# Patient Record
Sex: Male | Born: 1945 | Race: Black or African American | Hispanic: No | Marital: Married | Smoking: Former smoker
Health system: Southern US, Community
[De-identification: ages and names within clinical notes are randomized; demographics above are authoritative.]

## PROBLEM LIST (undated history)

## (undated) DIAGNOSIS — Z951 Presence of aortocoronary bypass graft: Secondary | ICD-10-CM

## (undated) DIAGNOSIS — I25119 Atherosclerotic heart disease of native coronary artery with unspecified angina pectoris: Secondary | ICD-10-CM

## (undated) DIAGNOSIS — R7303 Prediabetes: Secondary | ICD-10-CM

## (undated) DIAGNOSIS — R072 Precordial pain: Secondary | ICD-10-CM

## (undated) DIAGNOSIS — I214 Non-ST elevation (NSTEMI) myocardial infarction: Secondary | ICD-10-CM

## (undated) DIAGNOSIS — I209 Angina pectoris, unspecified: Secondary | ICD-10-CM

## (undated) DIAGNOSIS — I10 Essential (primary) hypertension: Secondary | ICD-10-CM

## (undated) DIAGNOSIS — I2510 Atherosclerotic heart disease of native coronary artery without angina pectoris: Secondary | ICD-10-CM

## (undated) DIAGNOSIS — I219 Acute myocardial infarction, unspecified: Secondary | ICD-10-CM

## (undated) DIAGNOSIS — F17210 Nicotine dependence, cigarettes, uncomplicated: Secondary | ICD-10-CM

## (undated) DIAGNOSIS — E785 Hyperlipidemia, unspecified: Secondary | ICD-10-CM

## (undated) DIAGNOSIS — E782 Mixed hyperlipidemia: Secondary | ICD-10-CM

## (undated) HISTORY — DX: Acute myocardial infarction, unspecified: I21.9

## (undated) HISTORY — PX: CATARACT EXTRACTION, BILATERAL: SHX1313

## (undated) HISTORY — DX: Hyperlipidemia, unspecified: E78.5

## (undated) HISTORY — DX: Atherosclerotic heart disease of native coronary artery without angina pectoris: I25.10

## (undated) HISTORY — DX: Angina pectoris, unspecified: I20.9

## (undated) HISTORY — DX: Prediabetes: R73.03

## (undated) HISTORY — DX: Essential (primary) hypertension: I10

---
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
## 1973-03-31 HISTORY — PX: OTHER SURGICAL HISTORY: SHX169

## 2002-03-31 HISTORY — PX: CORONARY ANGIOPLASTY WITH STENT PLACEMENT: SHX49

## 2014-04-11 DIAGNOSIS — E782 Mixed hyperlipidemia: Secondary | ICD-10-CM | POA: Insufficient documentation

## 2014-04-11 DIAGNOSIS — I10 Essential (primary) hypertension: Secondary | ICD-10-CM

## 2014-04-11 DIAGNOSIS — R072 Precordial pain: Secondary | ICD-10-CM

## 2014-04-11 HISTORY — DX: Mixed hyperlipidemia: E78.2

## 2014-04-11 HISTORY — DX: Precordial pain: R07.2

## 2014-04-11 HISTORY — DX: Essential (primary) hypertension: I10

## 2014-10-25 DIAGNOSIS — F17210 Nicotine dependence, cigarettes, uncomplicated: Secondary | ICD-10-CM

## 2014-10-25 DIAGNOSIS — Z87891 Personal history of nicotine dependence: Secondary | ICD-10-CM

## 2014-10-25 HISTORY — DX: Personal history of nicotine dependence: Z87.891

## 2014-10-25 HISTORY — DX: Nicotine dependence, cigarettes, uncomplicated: F17.210

## 2015-11-30 HISTORY — PX: CARDIAC CATHETERIZATION: SHX172

## 2015-12-27 DIAGNOSIS — I214 Non-ST elevation (NSTEMI) myocardial infarction: Secondary | ICD-10-CM

## 2015-12-27 HISTORY — DX: Non-ST elevation (NSTEMI) myocardial infarction: I21.4

## 2015-12-31 DIAGNOSIS — I25119 Atherosclerotic heart disease of native coronary artery with unspecified angina pectoris: Secondary | ICD-10-CM

## 2015-12-31 DIAGNOSIS — I2510 Atherosclerotic heart disease of native coronary artery without angina pectoris: Principal | ICD-10-CM

## 2015-12-31 HISTORY — DX: Atherosclerotic heart disease of native coronary artery with unspecified angina pectoris: I25.119

## 2015-12-31 HISTORY — PX: CORONARY ARTERY BYPASS GRAFT: SHX141

## 2016-01-10 DIAGNOSIS — Z951 Presence of aortocoronary bypass graft: Secondary | ICD-10-CM

## 2016-01-10 HISTORY — DX: Presence of aortocoronary bypass graft: Z95.1

## 2016-01-22 ENCOUNTER — Encounter: Payer: Self-pay | Admitting: Cardiothoracic Surgery

## 2016-01-23 ENCOUNTER — Encounter: Payer: Self-pay | Admitting: Cardiology

## 2016-01-23 DIAGNOSIS — I2510 Atherosclerotic heart disease of native coronary artery without angina pectoris: Secondary | ICD-10-CM
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
## 2016-01-28 ENCOUNTER — Other Ambulatory Visit: Payer: Self-pay | Admitting: Cardiothoracic Surgery

## 2016-01-28 DIAGNOSIS — Z951 Presence of aortocoronary bypass graft: Secondary | ICD-10-CM

## 2016-01-29 ENCOUNTER — Institutional Professional Consult (permissible substitution) (INDEPENDENT_AMBULATORY_CARE_PROVIDER_SITE_OTHER): Payer: Medicare Other | Admitting: Cardiothoracic Surgery

## 2016-01-29 ENCOUNTER — Other Ambulatory Visit: Payer: Self-pay

## 2016-01-29 ENCOUNTER — Ambulatory Visit
Admission: RE | Admit: 2016-01-29 | Discharge: 2016-01-29 | Disposition: A | Discharge status: Home / Self Care | Payer: Medicare Other | Source: Ambulatory Visit | Attending: Cardiothoracic Surgery | Admitting: Cardiothoracic Surgery

## 2016-01-29 VITALS — BP 132/79 | HR 78 | Resp 16 | Ht 66.0 in | Wt 171.0 lb

## 2016-01-29 DIAGNOSIS — Z951 Presence of aortocoronary bypass graft: Secondary | ICD-10-CM

## 2016-01-29 DIAGNOSIS — I214 Non-ST elevation (NSTEMI) myocardial infarction: Secondary | ICD-10-CM

## 2016-01-29 DIAGNOSIS — I2510 Atherosclerotic heart disease of native coronary artery without angina pectoris: Secondary | ICD-10-CM
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
## 2016-01-30 ENCOUNTER — Other Ambulatory Visit: Payer: Self-pay | Admitting: *Deleted

## 2016-01-30 DIAGNOSIS — R918 Other nonspecific abnormal finding of lung field: Secondary | ICD-10-CM

## 2016-01-31 ENCOUNTER — Telehealth (HOSPITAL_COMMUNITY): Payer: Self-pay | Admitting: *Deleted

## 2016-01-31 NOTE — Telephone Encounter (Signed)
Received referral from Dr. Wynonia Lawman office for cardiac rehab.  Pt called and message left for pt to please contact for additional information and for sign up. Cherre Huger, BSN

## 2016-02-26 ENCOUNTER — Encounter (HOSPITAL_COMMUNITY)
Admission: RE | Admit: 2016-02-26 | Discharge: 2016-02-26 | Disposition: A | Discharge status: Home / Self Care | Payer: Medicare Other | Source: Ambulatory Visit | Attending: Cardiology | Admitting: Cardiology

## 2016-02-26 ENCOUNTER — Encounter (HOSPITAL_COMMUNITY): Payer: Self-pay

## 2016-02-26 VITALS — BP 142/68 | HR 78 | Ht 65.0 in | Wt 181.2 lb

## 2016-02-26 DIAGNOSIS — Z951 Presence of aortocoronary bypass graft: Secondary | ICD-10-CM | POA: Diagnosis present

## 2016-02-26 DIAGNOSIS — I214 Non-ST elevation (NSTEMI) myocardial infarction: Secondary | ICD-10-CM

## 2016-02-26 DIAGNOSIS — Z48812 Encounter for surgical aftercare following surgery on the circulatory system: Secondary | ICD-10-CM | POA: Insufficient documentation

## 2016-02-26 NOTE — Progress Notes (Signed)
Cardiac Rehab Medication Review by a Pharmacist  Does the patient  feel that his/her medications are working for him/her?  Somewhat - cannot tell any difference   Has the patient been experiencing any side effects to the medications prescribed?  no  Does the patient measure his/her own blood pressure or blood glucose at home?  yes   Does the patient have any problems obtaining medications due to transportation or finances?   no  Understanding of regimen: good Understanding of indications: fair Potential of compliance: good    Pharmacist comments: Patient presents in fair spirits with wife ambulating without assistance. Patient reports he is no longer taking KCl, amiodarone, ferrous sulfate, polyethylene glycol at the direction of his physician. Those have been discontinued from the patient's list. All doses, dosing schedules and indications were reviewed with the patient and his wife. Patient denies further questions.    Carlean Jews, Pharm.D. PGY1 Pharmacy Resident 11/28/20173:11 PM Pager [phone]

## 2016-02-26 NOTE — Progress Notes (Signed)
Cardiac Individual Treatment Plan  Patient Details  Name: Gabriel [name] MRN: [id number] Date of Birth: January 27, 1946 Referring Provider:   Flowsheet Row CARDIAC REHAB PHASE II ORIENTATION from 02/26/2016 in [place]  Referring Provider  Jacolyn Reedy., MD.      Initial Encounter Date:  [facility] PHASE II ORIENTATION from 02/26/2016 in [place]  Date  02/26/16  Referring Provider  Jacolyn Reedy., MD.      Visit Diagnosis: S/P CABG x 3  NSTEMI (non-ST elevated myocardial infarction) ([place])  Patient's Home Medications on Admission:  Current Outpatient Prescriptions:  .  atorvastatin (LIPITOR) 80 MG tablet, TAKE 1 TABLET EVERY DAY, Disp: , Rfl:  .  carvedilol (COREG) 3.125 MG tablet, Take 3.125 mg by mouth 2 (two) times daily with a meal. , Disp: , Rfl:  .  clopidogrel (PLAVIX) 75 MG tablet, TAKE 1 TABLET (75 MG TOTAL) BY MOUTH DAILY., Disp: , Rfl:  .  EQ ASPIRIN ADULT LOW DOSE 81 MG EC tablet, Take 81 mg by mouth daily. , Disp: , Rfl:  .  lisinopril (PRINIVIL,ZESTRIL) 5 MG tablet, Take 5 mg by mouth daily. , Disp: , Rfl:  .  multivitamin-iron-minerals-folic acid (CENTRUM) chewable tablet, Chew 1 tablet by mouth daily. , Disp: , Rfl:   Past Medical History: Past Medical History:  Diagnosis Date  . CAD (coronary artery disease)   . Hyperlipidemia   . Hypertension   . MI (myocardial infarction)     Tobacco Use: History  Smoking Status  . Former Smoker  . Packs/day: 1.00  . Years: 50.00  . Types: Cigarettes  . Quit date: 10/29/2015  Smokeless Tobacco  . Never Used    Labs: Recent Review Flowsheet Data    There is no flowsheet data to display.      Capillary Blood Glucose: No results found for: GLUCAP   Exercise Target Goals: Date: 02/26/16  Exercise Program Goal: Individual exercise prescription set with THRR, safety & activity barriers. Participant demonstrates ability to  understand and report RPE using BORG scale, to self-measure pulse accurately, and to acknowledge the importance of the exercise prescription.  Exercise Prescription Goal: Starting with aerobic activity 30 plus minutes a day, 3 days per week for initial exercise prescription. Provide home exercise prescription and guidelines that participant acknowledges understanding prior to discharge.  Activity Barriers & Risk Stratification:     Activity Barriers & Cardiac Risk Stratification - 02/26/16 1625      Activity Barriers & Cardiac Risk Stratification   Activity Barriers Other (comment)   Comments Numbness in right/lateral part of leg.   Cardiac Risk Stratification High      6 Minute Walk:     6 Minute Walk    Row Name 02/26/16 1621         6 Minute Walk   Phase Initial     Distance 1326 feet     Walk Time 6 minutes     # of Rest Breaks 0     MPH 2.51     METS 2.95     RPE 11     VO2 Peak 10.32     Symptoms Yes (comment)     Comments Right calf tightness.     Resting HR 78 bpm     Resting BP 142/68     Max Ex. HR 98 bpm     Max Ex. BP 144/82     2 Minute Post BP  124/70        Initial Exercise Prescription:     Initial Exercise Prescription - 02/26/16 1600      Date of Initial Exercise RX and Referring Provider   Date 02/26/16   Referring Provider Jacolyn Reedy., MD.     Bike   Level 0.8   Minutes 10   METs 2.84     NuStep   Level 2   Minutes 10   METs 2.8     Track   Laps 11   Minutes 10   METs 2.92     Prescription Details   Frequency (times per week) 3   Duration Progress to 30 minutes of continuous aerobic without signs/symptoms of physical distress     Intensity   THRR 40-80% of Max Heartrate 61-122   Ratings of Perceived Exertion 11-13   Perceived Dyspnea 0-4     Progression   Progression Continue to progress workloads to maintain intensity without signs/symptoms of physical distress.     Resistance Training   Training Prescription  Yes   Weight 3lbs.   Reps 10-12      Perform Capillary Blood Glucose checks as needed.  Exercise Prescription Changes:   Exercise Comments:   Discharge Exercise Prescription (Final Exercise Prescription Changes):   Nutrition:  Target Goals: Understanding of nutrition guidelines, daily intake of sodium 1500mg , cholesterol 200mg , calories 30% from fat and 7% or less from saturated fats, daily to have 5 or more servings of fruits and vegetables.  Biometrics:     Pre Biometrics - 02/26/16 1624      Pre Biometrics   Height 5\' 5"  (1.651 m)   Weight 181 lb 3.5 oz (82.2 kg)   Waist Circumference 37.75 inches   Hip Circumference 38.5 inches   Waist to Hip Ratio 0.98 %   BMI (Calculated) 30.2   Triceps Skinfold 10 mm   % Body Fat 26.1 %   Grip Strength 57 kg   Flexibility 10.5 in   Single Leg Stand 30 seconds       Nutrition Therapy Plan and Nutrition Goals:   Nutrition Discharge: Nutrition Scores:   Nutrition Goals Re-Evaluation:   Psychosocial: Target Goals: Acknowledge presence or absence of depression, maximize coping skills, provide positive support system. Participant is able to verbalize types and ability to use techniques and skills needed for reducing stress and depression.  Initial Review & Psychosocial Screening:     Initial Psych Review & Screening - 02/26/16 [place]? Yes     Barriers   Psychosocial barriers to participate in program There are no identifiable barriers or psychosocial needs.     Screening Interventions   Interventions Encouraged to exercise      Quality of Life Scores:     Quality of Life - 02/26/16 1623      Quality of Life Scores   Health/Function Pre 23.1 %   Socioeconomic Pre 23.83 %   Psych/Spiritual Pre 24.86 %   Family Pre 24 %   GLOBAL Pre 23.74 %      PHQ-9: Recent Review Flowsheet Data    There is no flowsheet data to display.      Psychosocial Evaluation and  Intervention:   Psychosocial Re-Evaluation:   Vocational Rehabilitation: Provide vocational rehab assistance to qualifying candidates.   Vocational Rehab Evaluation & Intervention:     Vocational Rehab - 02/26/16 1654      Initial Vocational Rehab Evaluation &  Intervention   Assessment shows need for Vocational Rehabilitation No  Mr Huebel is retired      Education: Education Goals: Education classes will be provided on a weekly basis, covering required topics. Participant will state understanding/return demonstration of topics presented.  Learning Barriers/Preferences:     Learning Barriers/Preferences - 02/26/16 1626      Learning Barriers/Preferences   Learning Barriers None;Sight   Learning Preferences Skilled Demonstration      Education Topics: Count Your Pulse:  -Group instruction provided by verbal instruction, demonstration, patient participation and written materials to support subject.  Instructors address importance of being able to find your pulse and how to count your pulse when at home without a heart monitor.  Patients get hands on experience counting their pulse with staff help and individually.   Heart Attack, Angina, and Risk Factor Modification:  -Group instruction provided by verbal instruction, video, and written materials to support subject.  Instructors address signs and symptoms of angina and heart attacks.    Also discuss risk factors for heart disease and how to make changes to improve heart health risk factors.   Functional Fitness:  -Group instruction provided by verbal instruction, demonstration, patient participation, and written materials to support subject.  Instructors address safety measures for doing things around the house.  Discuss how to get up and down off the floor, how to pick things up properly, how to safely get out of a chair without assistance, and balance training.   Meditation and Mindfulness:  -Group instruction provided  by verbal instruction, patient participation, and written materials to support subject.  Instructor addresses importance of mindfulness and meditation practice to help reduce stress and improve awareness.  Instructor also leads participants through a meditation exercise.    Stretching for Flexibility and Mobility:  -Group instruction provided by verbal instruction, patient participation, and written materials to support subject.  Instructors lead participants through series of stretches that are designed to increase flexibility thus improving mobility.  These stretches are additional exercise for major muscle groups that are typically performed during regular warm up and cool down.   Hands Only CPR Anytime:  -Group instruction provided by verbal instruction, video, patient participation and written materials to support subject.  Instructors co-teach with AHA video for hands only CPR.  Participants get hands on experience with mannequins.   Nutrition I class: Heart Healthy Eating:  -Group instruction provided by PowerPoint slides, verbal discussion, and written materials to support subject matter. The instructor gives an explanation and review of the Therapeutic Lifestyle Changes diet recommendations, which includes a discussion on lipid goals, dietary fat, sodium, fiber, plant stanol/sterol esters, sugar, and the components of a well-balanced, healthy diet.   Nutrition II class: Lifestyle Skills:  -Group instruction provided by PowerPoint slides, verbal discussion, and written materials to support subject matter. The instructor gives an explanation and review of label reading, grocery shopping for heart health, heart healthy recipe modifications, and ways to make healthier choices when eating out.   Diabetes Question & Answer:  -Group instruction provided by PowerPoint slides, verbal discussion, and written materials to support subject matter. The instructor gives an explanation and review of  diabetes co-morbidities, pre- and post-prandial blood glucose goals, pre-exercise blood glucose goals, signs, symptoms, and treatment of hypoglycemia and hyperglycemia, and foot care basics.   Diabetes Blitz:  -Group instruction provided by PowerPoint slides, verbal discussion, and written materials to support subject matter. The instructor gives an explanation and review of the physiology behind type 1  and type 2 diabetes, diabetes medications and rational behind using different medications, pre- and post-prandial blood glucose recommendations and Hemoglobin A1c goals, diabetes diet, and exercise including blood glucose guidelines for exercising safely.    Portion Distortion:  -Group instruction provided by PowerPoint slides, verbal discussion, written materials, and food models to support subject matter. The instructor gives an explanation of serving size versus portion size, changes in portions sizes over the last 20 years, and what consists of a serving from each food group.   Stress Management:  -Group instruction provided by verbal instruction, video, and written materials to support subject matter.  Instructors review role of stress in heart disease and how to cope with stress positively.     Exercising on Your Own:  -Group instruction provided by verbal instruction, power point, and written materials to support subject.  Instructors discuss benefits of exercise, components of exercise, frequency and intensity of exercise, and end points for exercise.  Also discuss use of nitroglycerin and activating EMS.  Review options of places to exercise outside of rehab.  Review guidelines for sex with heart disease.   Cardiac Drugs I:  -Group instruction provided by verbal instruction and written materials to support subject.  Instructor reviews cardiac drug classes: antiplatelets, anticoagulants, beta blockers, and statins.  Instructor discusses reasons, side effects, and lifestyle considerations  for each drug class.   Cardiac Drugs II:  -Group instruction provided by verbal instruction and written materials to support subject.  Instructor reviews cardiac drug classes: angiotensin converting enzyme inhibitors (ACE-I), angiotensin II receptor blockers (ARBs), nitrates, and calcium channel blockers.  Instructor discusses reasons, side effects, and lifestyle considerations for each drug class.   Anatomy and Physiology of the Circulatory System:  -Group instruction provided by verbal instruction, video, and written materials to support subject.  Reviews functional anatomy of heart, how it relates to various diagnoses, and what role the heart plays in the overall system.   Knowledge Questionnaire Score:     Knowledge Questionnaire Score - 02/26/16 1615      Knowledge Questionnaire Score   Pre Score 24/28      Core Components/Risk Factors/Patient Goals at Admission:     Personal Goals and Risk Factors at Admission - 02/26/16 1616      Core Components/Risk Factors/Patient Goals on Admission    Weight Management Yes;Obesity;Weight Loss   Admit Weight 181 lb 3.5 oz (82.2 kg)   Goal Weight: Short Term 176 lb (79.8 kg)   Goal Weight: Long Term 171 lb (77.6 kg)   Expected Outcomes Short Term: Continue to assess and modify interventions until short term weight is achieved;Long Term: Adherence to nutrition and physical activity/exercise program aimed toward attainment of established weight goal;Weight Loss: Understanding of general recommendations for a balanced deficit meal plan, which promotes 1-2 lb weight loss per week and includes a negative energy balance of [phone] kcal/d;Understanding recommendations for meals to include 15-35% energy as protein, 25-35% energy from fat, 35-60% energy from carbohydrates, less than 200mg  of dietary cholesterol, 20-35 gm of total fiber daily;Understanding of distribution of calorie intake throughout the day with the consumption of 4-5 meals/snacks    Sedentary Yes   Intervention Provide advice, education, support and counseling about physical activity/exercise needs.;Develop an individualized exercise prescription for aerobic and resistive training based on initial evaluation findings, risk stratification, comorbidities and participant's personal goals.   Expected Outcomes Achievement of increased cardiorespiratory fitness and enhanced flexibility, muscular endurance and strength shown through measurements of functional capacity and personal statement  of participant.   Increase Strength and Stamina Yes   Intervention Provide advice, education, support and counseling about physical activity/exercise needs.;Develop an individualized exercise prescription for aerobic and resistive training based on initial evaluation findings, risk stratification, comorbidities and participant's personal goals.   Expected Outcomes Achievement of increased cardiorespiratory fitness and enhanced flexibility, muscular endurance and strength shown through measurements of functional capacity and personal statement of participant.   Tobacco Cessation Yes  Quit on 10/29/15.   Number of packs per day 1   Hypertension Yes   Intervention Provide education on lifestyle modifcations including regular physical activity/exercise, weight management, moderate sodium restriction and increased consumption of fresh fruit, vegetables, and low fat dairy, alcohol moderation, and smoking cessation.;Monitor prescription use compliance.   Expected Outcomes Short Term: Continued assessment and intervention until BP is < 140/23mm HG in hypertensive participants. < 130/48mm HG in hypertensive participants with diabetes, heart failure or chronic kidney disease.;Long Term: Maintenance of blood pressure at goal levels.   Lipids Yes   Intervention Provide education and support for participant on nutrition & aerobic/resistive exercise along with prescribed medications to achieve LDL 70mg , HDL >40mg .    Expected Outcomes Short Term: Participant states understanding of desired cholesterol values and is compliant with medications prescribed. Participant is following exercise prescription and nutrition guidelines.;Long Term: Cholesterol controlled with medications as prescribed, with individualized exercise RX and with personalized nutrition plan. Value goals: LDL < 70mg , HDL > 40 mg.   Personal Goal Other Yes   Personal Goal Increase upper body strength.   Intervention Progress upper body hand weights and initiate appropriate strength training program to promote increased upper body strength.   Expected Outcomes Increased upper body strength as measured by grip strength measurement.      Core Components/Risk Factors/Patient Goals Review:    Core Components/Risk Factors/Patient Goals at Discharge (Final Review):    ITP Comments:     ITP Comments    Row Name 02/26/16 1333           ITP Comments Medical Director- Dr. Fransico Him, MD.          Comments: Mr Iwen attended orientation from 1300 to 1530 to review rules and guidelines for program. Completed 6 minute walk test, Intitial ITP, and exercise prescription.  VSS. Telemetry-Sinus Rhythm with intermittent PVC's. Dr Thurman Coyer office called and notified.left message on voicemail and faxed today's ECG tracings for review Asymptomatic. Will continue to monitor the patient throughout  the program.Jveon Pound Venetia Maxon, RN,BSN 02/26/2016 4:59 PM

## 2016-03-03 ENCOUNTER — Encounter (HOSPITAL_COMMUNITY): Payer: Medicare Other

## 2016-03-05 ENCOUNTER — Encounter (HOSPITAL_COMMUNITY)
Admission: RE | Admit: 2016-03-05 | Discharge: 2016-03-05 | Disposition: A | Discharge status: Home / Self Care | Payer: Medicare Other | Source: Ambulatory Visit | Attending: Cardiology | Admitting: Cardiology

## 2016-03-05 DIAGNOSIS — I214 Non-ST elevation (NSTEMI) myocardial infarction: Secondary | ICD-10-CM | POA: Insufficient documentation

## 2016-03-05 DIAGNOSIS — Z951 Presence of aortocoronary bypass graft: Secondary | ICD-10-CM | POA: Insufficient documentation

## 2016-03-05 NOTE — Progress Notes (Signed)
Daily Session Note  Patient Details  Name: Gabriel [name] MRN: [id number] Date of Birth: Aug 10, 1945 Referring Provider:   Flowsheet Row CARDIAC REHAB PHASE II ORIENTATION from 02/26/2016 in [place]  Referring Provider  Jacolyn Reedy., MD.      Encounter Date: 03/05/2016  Check In:     Session Check In - 03/05/16 1113      Check-In   Location [facility]   Staff Present Barnet Pall, RN, BSN;Amber Fair, MS, ACSM RCEP, Exercise Physiologist;Olinty Celesta Aver, MS, ACSM CEP, Exercise Physiologist;Ashley Armstrong, MS, Exercise Physiologist;Phallon Haydu Wilber Oliphant, RN, BSN   Supervising physician immediately available to respond to emergencies [facility] immediately available   Physician(s) Dr. Allyson Sabal   Medication changes reported     No   Fall or balance concerns reported    No   Warm-up and Cool-down Performed as group-led instruction   Resistance Training Performed No   VAD Patient? No     Pain Assessment   Currently in Pain? No/denies      Capillary Blood Glucose: No results found for this or any previous visit (from the past 24 hour(s)).   Goals Met:  Exercise tolerated well Personal goals reviewed  Goals Unmet:  Not Applicable  Comments:  Pt started full exercise in cardiac rehab today.  Pt tolerated light exercise without difficulty. VSS, telemetry-SR with frequent pvc, asymptomatic.  Medication list reconciled with pt wife who arranges his medications at homes. Pt denies barriers to medication compliance.  PSYCHOSOCIAL ASSESSMENT:  PHQ-0. Pt exhibits positive coping skills, hopeful outlook with supportive family. Pt wife accompanied him to orientation and she is present today.  QUALITY OF LIFE SCORE REVIEW  Pt completed Quality of Life survey as a participant in Cardiac Rehab. Scores 21.0 or below are considered low. Pt scored the following      Quality of Life - 02/26/16 1623      Quality of Life Scores   Health/Function Pre 23.1 %   Socioeconomic Pre 23.83 %   Psych/Spiritual Pre 24.86 %   Family Pre 24 %   GLOBAL Pre 23.74 %    Pt scored well above the threshold of 2l. No psychosocial needs identified at this time, no psychosocial interventions necessary.    Pt enjoys shooting pool, working with his hands around the house, home maintenance and yard work. Pt desires to increase his upper body strength.  pt had limited his arm use due to his recovery from heart surgery. As a result he feels weaker with his arm strength.  Pt also would like to lose weight. Pt desires a weight of about 180 pounds presently pt weighs around 190.  This is the heaviest pt has been and has noticed that his clothes are fitting tighter.  Pt oriented to exercise equipment and routine.    Understanding verbalized. [name] RN, BSN     Dr. Fransico Him is Medical Director for Cardiac Rehab at [facility].

## 2016-03-06 ENCOUNTER — Ambulatory Visit
Admission: RE | Admit: 2016-03-06 | Discharge: 2016-03-06 | Disposition: A | Discharge status: Home / Self Care | Payer: Medicare Other | Source: Ambulatory Visit | Attending: Cardiothoracic Surgery | Admitting: Cardiothoracic Surgery

## 2016-03-06 ENCOUNTER — Encounter: Payer: Self-pay | Admitting: Cardiothoracic Surgery

## 2016-03-06 ENCOUNTER — Ambulatory Visit (INDEPENDENT_AMBULATORY_CARE_PROVIDER_SITE_OTHER): Payer: Medicare Other | Admitting: Cardiothoracic Surgery

## 2016-03-06 VITALS — BP 129/73 | HR 78 | Resp 20 | Ht 65.0 in | Wt 180.0 lb

## 2016-03-06 DIAGNOSIS — Z951 Presence of aortocoronary bypass graft: Secondary | ICD-10-CM | POA: Diagnosis not present

## 2016-03-06 DIAGNOSIS — R918 Other nonspecific abnormal finding of lung field: Secondary | ICD-10-CM

## 2016-03-06 DIAGNOSIS — I2510 Atherosclerotic heart disease of native coronary artery without angina pectoris: Secondary | ICD-10-CM

## 2016-03-06 DIAGNOSIS — R911 Solitary pulmonary nodule: Secondary | ICD-10-CM | POA: Diagnosis not present

## 2016-03-06 NOTE — Progress Notes (Signed)
Cardiac Individual Treatment Plan  Patient Details  Name: Gabriel [name] MRN: [id number] Date of Birth: 07-30-1945 Referring Provider:   Flowsheet Row CARDIAC REHAB PHASE II ORIENTATION from 02/26/2016 in [facility]  Referring Provider  Jacolyn Reedy., MD.      Initial Encounter Date:  [place] PHASE II ORIENTATION from 02/26/2016 in [facility]  Date  02/26/16  Referring Provider  Jacolyn Reedy., MD.      Visit Diagnosis: S/P CABG x 3  NSTEMI (non-ST elevated myocardial infarction) ([place])  Patient's Home Medications on Admission:  Current Outpatient Prescriptions:  .  atorvastatin (LIPITOR) 80 MG tablet, TAKE 1 TABLET EVERY DAY, Disp: , Rfl:  .  carvedilol (COREG) 3.125 MG tablet, Take 3.125 mg by mouth 2 (two) times daily with a meal. , Disp: , Rfl:  .  clopidogrel (PLAVIX) 75 MG tablet, TAKE 1 TABLET (75 MG TOTAL) BY MOUTH DAILY., Disp: , Rfl:  .  EQ ASPIRIN ADULT LOW DOSE 81 MG EC tablet, Take 81 mg by mouth daily. , Disp: , Rfl:  .  lisinopril (PRINIVIL,ZESTRIL) 5 MG tablet, Take 5 mg by mouth daily. , Disp: , Rfl:  .  multivitamin-iron-minerals-folic acid (CENTRUM) chewable tablet, Chew 1 tablet by mouth daily. , Disp: , Rfl:   Past Medical History: Past Medical History:  Diagnosis Date  . CAD (coronary artery disease)   . Hyperlipidemia   . Hypertension   . MI (myocardial infarction)     Tobacco Use: History  Smoking Status  . Former Smoker  . Packs/day: 1.00  . Years: 50.00  . Types: Cigarettes  . Quit date: 10/29/2015  Smokeless Tobacco  . Never Used    Labs: Recent Review Flowsheet Data    There is no flowsheet data to display.      Capillary Blood Glucose: No results found for: GLUCAP   Exercise Target Goals:    Exercise Program Goal: Individual exercise prescription set with THRR, safety & activity barriers. Participant demonstrates ability to understand  and report RPE using BORG scale, to self-measure pulse accurately, and to acknowledge the importance of the exercise prescription.  Exercise Prescription Goal: Starting with aerobic activity 30 plus minutes a day, 3 days per week for initial exercise prescription. Provide home exercise prescription and guidelines that participant acknowledges understanding prior to discharge.  Activity Barriers & Risk Stratification:     Activity Barriers & Cardiac Risk Stratification - 02/26/16 1625      Activity Barriers & Cardiac Risk Stratification   Activity Barriers Other (comment)   Comments Numbness in right/lateral part of leg.   Cardiac Risk Stratification High      6 Minute Walk:     6 Minute Walk    Row Name 02/26/16 1621         6 Minute Walk   Phase Initial     Distance 1326 feet     Walk Time 6 minutes     # of Rest Breaks 0     MPH 2.51     METS 2.95     RPE 11     VO2 Peak 10.32     Symptoms Yes (comment)     Comments Right calf tightness.     Resting HR 78 bpm     Resting BP 142/68     Max Ex. HR 98 bpm     Max Ex. BP 144/82     2 Minute Post BP  124/70        Initial Exercise Prescription:     Initial Exercise Prescription - 02/26/16 1600      Date of Initial Exercise RX and Referring Provider   Date 02/26/16   Referring Provider Jacolyn Reedy., MD.     Bike   Level 0.8   Minutes 10   METs 2.84     NuStep   Level 2   Minutes 10   METs 2.8     Track   Laps 11   Minutes 10   METs 2.92     Prescription Details   Frequency (times per week) 3   Duration Progress to 30 minutes of continuous aerobic without signs/symptoms of physical distress     Intensity   THRR 40-80% of Max Heartrate 61-122   Ratings of Perceived Exertion 11-13   Perceived Dyspnea 0-4     Progression   Progression Continue to progress workloads to maintain intensity without signs/symptoms of physical distress.     Resistance Training   Training Prescription Yes    Weight 3lbs.   Reps 10-12      Perform Capillary Blood Glucose checks as needed.  Exercise Prescription Changes:   Exercise Comments:   Discharge Exercise Prescription (Final Exercise Prescription Changes):   Nutrition:  Target Goals: Understanding of nutrition guidelines, daily intake of sodium 1500mg , cholesterol 200mg , calories 30% from fat and 7% or less from saturated fats, daily to have 5 or more servings of fruits and vegetables.  Biometrics:     Pre Biometrics - 02/26/16 1624      Pre Biometrics   Height 5\' 5"  (1.651 m)   Weight 181 lb 3.5 oz (82.2 kg)   Waist Circumference 37.75 inches   Hip Circumference 38.5 inches   Waist to Hip Ratio 0.98 %   BMI (Calculated) 30.2   Triceps Skinfold 10 mm   % Body Fat 26.1 %   Grip Strength 57 kg   Flexibility 10.5 in   Single Leg Stand 30 seconds       Nutrition Therapy Plan and Nutrition Goals:     Nutrition Therapy & Goals - 03/05/16 1521      Nutrition Therapy   Diet Carb Modified, Therapeutic Lifestyle Changes     Personal Nutrition Goals   Personal Goal #1 1-2 lb wt loss/week to a wt loss goal of 6-24 lb at graduation from Cardiac Rehab   Personal Goal #2 Pt to have an understanding of DM diet     Intervention Plan   Intervention Prescribe, educate and counsel regarding individualized specific dietary modifications aiming towards targeted core components such as weight, hypertension, lipid management, diabetes, heart failure and other comorbidities.   Expected Outcomes Short Term Goal: Understand basic principles of dietary content, such as calories, fat, sodium, cholesterol and nutrients.;Long Term Goal: Adherence to prescribed nutrition plan.      Nutrition Discharge: Nutrition Scores:   Nutrition Goals Re-Evaluation:   Psychosocial: Target Goals: Acknowledge presence or absence of depression, maximize coping skills, provide positive support system. Participant is able to verbalize types and  ability to use techniques and skills needed for reducing stress and depression.  Initial Review & Psychosocial Screening:     Initial Psych Review & Screening - 02/26/16 [place]? Yes     Barriers   Psychosocial barriers to participate in program There are no identifiable barriers or psychosocial needs.     Screening  Interventions   Interventions Encouraged to exercise      Quality of Life Scores:     Quality of Life - 02/26/16 1623      Quality of Life Scores   Health/Function Pre 23.1 %   Socioeconomic Pre 23.83 %   Psych/Spiritual Pre 24.86 %   Family Pre 24 %   GLOBAL Pre 23.74 %      PHQ-9: Recent Review Flowsheet Data    Depression screen PHQ 2/9 03/05/2016   Decreased Interest 0   Down, Depressed, Hopeless 0   PHQ - 2 Score 0      Psychosocial Evaluation and Intervention:     Psychosocial Evaluation - 03/06/16 1415      Psychosocial Evaluation & Interventions   Interventions Encouraged to exercise with the program and follow exercise prescription   Comments Pt with suppportive family.  no needs identified      Psychosocial Re-Evaluation:     Psychosocial Re-Evaluation    [place] Name 03/06/16 1415             Psychosocial Re-Evaluation   Interventions Encouraged to attend Cardiac Rehabilitation for the exercise;Relaxation education;Stress management education          Vocational Rehabilitation: Provide vocational rehab assistance to qualifying candidates.   Vocational Rehab Evaluation & Intervention:     Vocational Rehab - 02/26/16 1654      Initial Vocational Rehab Evaluation & Intervention   Assessment shows need for Vocational Rehabilitation No  Mr Vajda is retired      Education: Education Goals: Education classes will be provided on a weekly basis, covering required topics. Participant will state understanding/return demonstration of topics presented.  Learning Barriers/Preferences:      Learning Barriers/Preferences - 02/26/16 1626      Learning Barriers/Preferences   Learning Barriers None;Sight   Learning Preferences Skilled Demonstration      Education Topics: Count Your Pulse:  -Group instruction provided by verbal instruction, demonstration, patient participation and written materials to support subject.  Instructors address importance of being able to find your pulse and how to count your pulse when at home without a heart monitor.  Patients get hands on experience counting their pulse with staff help and individually.   Heart Attack, Angina, and Risk Factor Modification:  -Group instruction provided by verbal instruction, video, and written materials to support subject.  Instructors address signs and symptoms of angina and heart attacks.    Also discuss risk factors for heart disease and how to make changes to improve heart health risk factors.   Functional Fitness:  -Group instruction provided by verbal instruction, demonstration, patient participation, and written materials to support subject.  Instructors address safety measures for doing things around the house.  Discuss how to get up and down off the floor, how to pick things up properly, how to safely get out of a chair without assistance, and balance training.   Meditation and Mindfulness:  -Group instruction provided by verbal instruction, patient participation, and written materials to support subject.  Instructor addresses importance of mindfulness and meditation practice to help reduce stress and improve awareness.  Instructor also leads participants through a meditation exercise.    Stretching for Flexibility and Mobility:  -Group instruction provided by verbal instruction, patient participation, and written materials to support subject.  Instructors lead participants through series of stretches that are designed to increase flexibility thus improving mobility.  These stretches are additional exercise  for major muscle groups that are typically performed during regular  warm up and cool down. Flowsheet Row CARDIAC REHAB PHASE II EXERCISE from 03/05/2016 in [facility]  Date  03/05/16  Instruction Review Code  2- meets goals/outcomes      Hands Only CPR Anytime:  -Group instruction provided by verbal instruction, video, patient participation and written materials to support subject.  Instructors co-teach with [facility] video for hands only CPR.  Participants get hands on experience with mannequins.   Nutrition I class: Heart Healthy Eating:  -Group instruction provided by PowerPoint slides, verbal discussion, and written materials to support subject matter. The instructor gives an explanation and review of the Therapeutic Lifestyle Changes diet recommendations, which includes a discussion on lipid goals, dietary fat, sodium, fiber, plant stanol/sterol esters, sugar, and the components of a well-balanced, healthy diet.   Nutrition II class: Lifestyle Skills:  -Group instruction provided by PowerPoint slides, verbal discussion, and written materials to support subject matter. The instructor gives an explanation and review of label reading, grocery shopping for heart health, heart healthy recipe modifications, and ways to make healthier choices when eating out.   Diabetes Question & Answer:  -Group instruction provided by PowerPoint slides, verbal discussion, and written materials to support subject matter. The instructor gives an explanation and review of diabetes co-morbidities, pre- and post-prandial blood glucose goals, pre-exercise blood glucose goals, signs, symptoms, and treatment of hypoglycemia and hyperglycemia, and foot care basics.   Diabetes Blitz:  -Group instruction provided by PowerPoint slides, verbal discussion, and written materials to support subject matter. The instructor gives an explanation and review of the physiology behind type 1 and type 2  diabetes, diabetes medications and rational behind using different medications, pre- and post-prandial blood glucose recommendations and Hemoglobin A1c goals, diabetes diet, and exercise including blood glucose guidelines for exercising safely.    Portion Distortion:  -Group instruction provided by PowerPoint slides, verbal discussion, written materials, and food models to support subject matter. The instructor gives an explanation of serving size versus portion size, changes in portions sizes over the last 20 years, and what consists of a serving from each food group.   Stress Management:  -Group instruction provided by verbal instruction, video, and written materials to support subject matter.  Instructors review role of stress in heart disease and how to cope with stress positively.     Exercising on Your Own:  -Group instruction provided by verbal instruction, power point, and written materials to support subject.  Instructors discuss benefits of exercise, components of exercise, frequency and intensity of exercise, and end points for exercise.  Also discuss use of nitroglycerin and activating EMS.  Review options of places to exercise outside of rehab.  Review guidelines for sex with heart disease.   Cardiac Drugs I:  -Group instruction provided by verbal instruction and written materials to support subject.  Instructor reviews cardiac drug classes: antiplatelets, anticoagulants, beta blockers, and statins.  Instructor discusses reasons, side effects, and lifestyle considerations for each drug class.   Cardiac Drugs II:  -Group instruction provided by verbal instruction and written materials to support subject.  Instructor reviews cardiac drug classes: angiotensin converting enzyme inhibitors (ACE-I), angiotensin II receptor blockers (ARBs), nitrates, and calcium channel blockers.  Instructor discusses reasons, side effects, and lifestyle considerations for each drug class.   Anatomy and  Physiology of the Circulatory System:  -Group instruction provided by verbal instruction, video, and written materials to support subject.  Reviews functional anatomy of heart, how it relates to various diagnoses, and what role  the heart plays in the overall system.   Knowledge Questionnaire Score:     Knowledge Questionnaire Score - 03/05/16 1551      Knowledge Questionnaire Score   Pre Score 24/28     DM 15/15      Core Components/Risk Factors/Patient Goals at Admission:     Personal Goals and Risk Factors at Admission - 02/26/16 1616      Core Components/Risk Factors/Patient Goals on Admission    Weight Management Yes;Obesity;Weight Loss   Admit Weight 181 lb 3.5 oz (82.2 kg)   Goal Weight: Short Term 176 lb (79.8 kg)   Goal Weight: Long Term 171 lb (77.6 kg)   Expected Outcomes Short Term: Continue to assess and modify interventions until short term weight is achieved;Long Term: Adherence to nutrition and physical activity/exercise program aimed toward attainment of established weight goal;Weight Loss: Understanding of general recommendations for a balanced deficit meal plan, which promotes 1-2 lb weight loss per week and includes a negative energy balance of [phone] kcal/d;Understanding recommendations for meals to include 15-35% energy as protein, 25-35% energy from fat, 35-60% energy from carbohydrates, less than 200mg  of dietary cholesterol, 20-35 gm of total fiber daily;Understanding of distribution of calorie intake throughout the day with the consumption of 4-5 meals/snacks   Sedentary Yes   Intervention Provide advice, education, support and counseling about physical activity/exercise needs.;Develop an individualized exercise prescription for aerobic and resistive training based on initial evaluation findings, risk stratification, comorbidities and participant's personal goals.   Expected Outcomes Achievement of increased cardiorespiratory fitness and enhanced flexibility,  muscular endurance and strength shown through measurements of functional capacity and personal statement of participant.   Increase Strength and Stamina Yes   Intervention Provide advice, education, support and counseling about physical activity/exercise needs.;Develop an individualized exercise prescription for aerobic and resistive training based on initial evaluation findings, risk stratification, comorbidities and participant's personal goals.   Expected Outcomes Achievement of increased cardiorespiratory fitness and enhanced flexibility, muscular endurance and strength shown through measurements of functional capacity and personal statement of participant.   Tobacco Cessation Yes  Quit on 10/29/15.   Number of packs per day 1   Hypertension Yes   Intervention Provide education on lifestyle modifcations including regular physical activity/exercise, weight management, moderate sodium restriction and increased consumption of fresh fruit, vegetables, and low fat dairy, alcohol moderation, and smoking cessation.;Monitor prescription use compliance.   Expected Outcomes Short Term: Continued assessment and intervention until BP is < 140/60mm HG in hypertensive participants. < 130/79mm HG in hypertensive participants with diabetes, heart failure or chronic kidney disease.;Long Term: Maintenance of blood pressure at goal levels.   Lipids Yes   Intervention Provide education and support for participant on nutrition & aerobic/resistive exercise along with prescribed medications to achieve LDL 70mg , HDL >40mg .   Expected Outcomes Short Term: Participant states understanding of desired cholesterol values and is compliant with medications prescribed. Participant is following exercise prescription and nutrition guidelines.;Long Term: Cholesterol controlled with medications as prescribed, with individualized exercise RX and with personalized nutrition plan. Value goals: LDL < 70mg , HDL > 40 mg.   Personal Goal  Other Yes   Personal Goal Increase upper body strength.   Intervention Progress upper body hand weights and initiate appropriate strength training program to promote increased upper body strength.   Expected Outcomes Increased upper body strength as measured by grip strength measurement.      Core Components/Risk Factors/Patient Goals Review:    Core Components/Risk Factors/Patient Goals at Discharge (Final Review):  ITP Comments:     ITP Comments    Row Name 02/26/16 1333           ITP Comments Medical Director- Dr. Fransico Him, MD.          Comments:  Pt is making is off to a good start toward personal goals after completing 2 sessions.Repeat Psychosocial Assessment: Pt with supportive wife, denies any Psychosocial needs or interventions at this time. . Recommend continued exercise and life style modification education including  stress management and relaxation techniques to decrease cardiac risk profile. Cherre Huger, BSN
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
## 2016-03-07 ENCOUNTER — Encounter (HOSPITAL_COMMUNITY)
Admission: RE | Admit: 2016-03-07 | Discharge: 2016-03-07 | Disposition: A | Discharge status: Home / Self Care | Payer: Medicare Other | Source: Ambulatory Visit | Attending: Cardiology | Admitting: Cardiology

## 2016-03-07 DIAGNOSIS — I214 Non-ST elevation (NSTEMI) myocardial infarction: Secondary | ICD-10-CM

## 2016-03-07 DIAGNOSIS — Z951 Presence of aortocoronary bypass graft: Secondary | ICD-10-CM

## 2016-03-10 ENCOUNTER — Encounter (HOSPITAL_COMMUNITY): Payer: Medicare Other

## 2016-03-12 ENCOUNTER — Encounter (HOSPITAL_COMMUNITY)
Admission: RE | Admit: 2016-03-12 | Discharge: 2016-03-12 | Disposition: A | Discharge status: Home / Self Care | Payer: Medicare Other | Source: Ambulatory Visit | Attending: Cardiology | Admitting: Cardiology

## 2016-03-12 DIAGNOSIS — I214 Non-ST elevation (NSTEMI) myocardial infarction: Secondary | ICD-10-CM | POA: Diagnosis not present

## 2016-03-12 DIAGNOSIS — Z951 Presence of aortocoronary bypass graft: Secondary | ICD-10-CM

## 2016-03-14 ENCOUNTER — Encounter (HOSPITAL_COMMUNITY)
Admission: RE | Admit: 2016-03-14 | Discharge: 2016-03-14 | Disposition: A | Discharge status: Home / Self Care | Payer: Medicare Other | Source: Ambulatory Visit | Attending: Cardiology | Admitting: Cardiology

## 2016-03-14 DIAGNOSIS — Z951 Presence of aortocoronary bypass graft: Secondary | ICD-10-CM

## 2016-03-14 DIAGNOSIS — I214 Non-ST elevation (NSTEMI) myocardial infarction: Secondary | ICD-10-CM

## 2016-03-17 ENCOUNTER — Encounter (HOSPITAL_COMMUNITY): Payer: Medicare Other

## 2016-03-17 ENCOUNTER — Other Ambulatory Visit: Payer: Self-pay | Admitting: *Deleted

## 2016-03-17 DIAGNOSIS — R911 Solitary pulmonary nodule: Secondary | ICD-10-CM

## 2016-03-19 ENCOUNTER — Encounter (HOSPITAL_COMMUNITY)
Admission: RE | Admit: 2016-03-19 | Discharge: 2016-03-19 | Disposition: A | Discharge status: Home / Self Care | Payer: Medicare Other | Source: Ambulatory Visit | Attending: Cardiology | Admitting: Cardiology

## 2016-03-19 DIAGNOSIS — Z951 Presence of aortocoronary bypass graft: Secondary | ICD-10-CM

## 2016-03-19 DIAGNOSIS — I214 Non-ST elevation (NSTEMI) myocardial infarction: Secondary | ICD-10-CM

## 2016-03-21 ENCOUNTER — Encounter (HOSPITAL_COMMUNITY)
Admission: RE | Admit: 2016-03-21 | Discharge: 2016-03-21 | Disposition: A | Discharge status: Home / Self Care | Payer: Medicare Other | Source: Ambulatory Visit | Attending: Cardiology | Admitting: Cardiology

## 2016-03-21 DIAGNOSIS — I214 Non-ST elevation (NSTEMI) myocardial infarction: Secondary | ICD-10-CM

## 2016-03-21 DIAGNOSIS — Z951 Presence of aortocoronary bypass graft: Secondary | ICD-10-CM

## 2016-03-26 ENCOUNTER — Encounter (HOSPITAL_COMMUNITY)
Admission: RE | Admit: 2016-03-26 | Discharge: 2016-03-26 | Disposition: A | Discharge status: Home / Self Care | Payer: Medicare Other | Source: Ambulatory Visit | Attending: Cardiology | Admitting: Cardiology

## 2016-03-26 DIAGNOSIS — I214 Non-ST elevation (NSTEMI) myocardial infarction: Secondary | ICD-10-CM

## 2016-03-26 DIAGNOSIS — Z951 Presence of aortocoronary bypass graft: Secondary | ICD-10-CM

## 2016-03-26 NOTE — Progress Notes (Signed)
Pt with noted elevation in bp at rest today with exercise. Pt reports nothing out of the ordinary for this morning.  Pt took his medications as prescribed and drank one cup of coffee which he generally has each day. For bp management pt takes Lisinopril 5 mg daily and for heart rate pt takes correg 3.125 mg twice a day.  Monitor shows SR with freq pvc's. Pt asymptomatic with no complaints.  Looking back with review of his BP shows pre hypertension readings upper 150's to low 160's over mid 80's.  Will fax rehab report to Dr. Wynonia Lawman for review. Cherre Huger, BSN

## 2016-03-26 NOTE — Progress Notes (Signed)
Reviewed home exercise with pt today.  Pt plans to walk for exercise, 3x/week in addition to coming to [facility].  Reviewed THR, pulse, RPE, sign and symptoms, and when to call 911 or MD.  Also discussed weather considerations and indoor options.  Pt voiced understanding.    Chasitie Passey [employer]

## 2016-03-27 ENCOUNTER — Telehealth (HOSPITAL_COMMUNITY): Payer: Self-pay | Admitting: Infectious Diseases

## 2016-03-28 ENCOUNTER — Encounter (HOSPITAL_COMMUNITY)
Admission: RE | Admit: 2016-03-28 | Discharge: 2016-03-28 | Disposition: A | Discharge status: Home / Self Care | Payer: Medicare Other | Source: Ambulatory Visit | Attending: Cardiology | Admitting: Cardiology

## 2016-03-28 DIAGNOSIS — Z951 Presence of aortocoronary bypass graft: Secondary | ICD-10-CM

## 2016-03-28 DIAGNOSIS — I214 Non-ST elevation (NSTEMI) myocardial infarction: Secondary | ICD-10-CM

## 2016-04-02 ENCOUNTER — Telehealth (HOSPITAL_COMMUNITY): Payer: Self-pay | Admitting: *Deleted

## 2016-04-02 ENCOUNTER — Encounter (HOSPITAL_COMMUNITY)
Admission: RE | Admit: 2016-04-02 | Discharge: 2016-04-02 | Disposition: A | Discharge status: Home / Self Care | Payer: Medicare Other | Source: Ambulatory Visit | Attending: Cardiology | Admitting: Cardiology

## 2016-04-02 DIAGNOSIS — Z951 Presence of aortocoronary bypass graft: Secondary | ICD-10-CM | POA: Insufficient documentation

## 2016-04-02 DIAGNOSIS — I214 Non-ST elevation (NSTEMI) myocardial infarction: Secondary | ICD-10-CM | POA: Insufficient documentation

## 2016-04-02 NOTE — Telephone Encounter (Signed)
Returned call from message pt wife left earlier.  Pt wife reports to rehab staff that pt did not take his usual dose of lisinopril 10 mg .  Pt was instructed by Dr. Wynonia Lawman to stop taking the lisinopril due to developing a cough.  Pt to be seen in the office on Monday.  Pt wife stated that Gabriel [name] will return to exercise on next Wednesday and not come on Friday.  Pt  Wife mentioned that he gets anxious when his bp runs high.  Will send rehab report over for tilley to review. Cherre Huger, BSN

## 2016-04-03 NOTE — Progress Notes (Signed)
Cardiac Individual Treatment Plan  Patient Details  Name: Gabriel [name] MRN: [id number] Date of Birth: 07-30-1945 Referring Provider:   Flowsheet Row CARDIAC REHAB PHASE II ORIENTATION from 02/26/2016 in [facility]  Referring Provider  Jacolyn Reedy., MD.      Initial Encounter Date:  [place] PHASE II ORIENTATION from 02/26/2016 in [facility]  Date  02/26/16  Referring Provider  Jacolyn Reedy., MD.      Visit Diagnosis: S/P CABG x 3  NSTEMI (non-ST elevated myocardial infarction) ([place])  Patient's Home Medications on Admission:  Current Outpatient Prescriptions:  .  atorvastatin (LIPITOR) 80 MG tablet, TAKE 1 TABLET EVERY DAY, Disp: , Rfl:  .  carvedilol (COREG) 3.125 MG tablet, Take 3.125 mg by mouth 2 (two) times daily with a meal. , Disp: , Rfl:  .  clopidogrel (PLAVIX) 75 MG tablet, TAKE 1 TABLET (75 MG TOTAL) BY MOUTH DAILY., Disp: , Rfl:  .  EQ ASPIRIN ADULT LOW DOSE 81 MG EC tablet, Take 81 mg by mouth daily. , Disp: , Rfl:  .  lisinopril (PRINIVIL,ZESTRIL) 5 MG tablet, Take 5 mg by mouth daily. , Disp: , Rfl:  .  multivitamin-iron-minerals-folic acid (CENTRUM) chewable tablet, Chew 1 tablet by mouth daily. , Disp: , Rfl:   Past Medical History: Past Medical History:  Diagnosis Date  . CAD (coronary artery disease)   . Hyperlipidemia   . Hypertension   . MI (myocardial infarction)     Tobacco Use: History  Smoking Status  . Former Smoker  . Packs/day: 1.00  . Years: 50.00  . Types: Cigarettes  . Quit date: 10/29/2015  Smokeless Tobacco  . Never Used    Labs: Recent Review Flowsheet Data    There is no flowsheet data to display.      Capillary Blood Glucose: No results found for: GLUCAP   Exercise Target Goals:    Exercise Program Goal: Individual exercise prescription set with THRR, safety & activity barriers. Participant demonstrates ability to understand  and report RPE using BORG scale, to self-measure pulse accurately, and to acknowledge the importance of the exercise prescription.  Exercise Prescription Goal: Starting with aerobic activity 30 plus minutes a day, 3 days per week for initial exercise prescription. Provide home exercise prescription and guidelines that participant acknowledges understanding prior to discharge.  Activity Barriers & Risk Stratification:     Activity Barriers & Cardiac Risk Stratification - 02/26/16 1625      Activity Barriers & Cardiac Risk Stratification   Activity Barriers Other (comment)   Comments Numbness in right/lateral part of leg.   Cardiac Risk Stratification High      6 Minute Walk:     6 Minute Walk    Row Name 02/26/16 1621         6 Minute Walk   Phase Initial     Distance 1326 feet     Walk Time 6 minutes     # of Rest Breaks 0     MPH 2.51     METS 2.95     RPE 11     VO2 Peak 10.32     Symptoms Yes (comment)     Comments Right calf tightness.     Resting HR 78 bpm     Resting BP 142/68     Max Ex. HR 98 bpm     Max Ex. BP 144/82     2 Minute Post BP  124/70        Initial Exercise Prescription:     Initial Exercise Prescription - 02/26/16 1600      Date of Initial Exercise RX and Referring Provider   Date 02/26/16   Referring Provider Jacolyn Reedy., MD.     Bike   Level 0.8   Minutes 10   METs 2.84     NuStep   Level 2   Minutes 10   METs 2.8     Track   Laps 11   Minutes 10   METs 2.92     Prescription Details   Frequency (times per week) 3   Duration Progress to 30 minutes of continuous aerobic without signs/symptoms of physical distress     Intensity   THRR 40-80% of Max Heartrate 61-122   Ratings of Perceived Exertion 11-13   Perceived Dyspnea 0-4     Progression   Progression Continue to progress workloads to maintain intensity without signs/symptoms of physical distress.     Resistance Training   Training Prescription Yes    Weight 3lbs.   Reps 10-12      Perform Capillary Blood Glucose checks as needed.  Exercise Prescription Changes:     Exercise Prescription Changes    Row Name 04/01/16 1200             Exercise Review   Progression Yes         Response to Exercise   Blood Pressure (Admit) 152/78       Blood Pressure (Exercise) 162/80  recheck BP 122/60       Blood Pressure (Exit) 150/86       Heart Rate (Admit) 68 bpm       Heart Rate (Exercise) 89 bpm       Heart Rate (Exit) 82 bpm       Rating of Perceived Exertion (Exercise) 11       Symptoms none       Comments Reviewed HEP on 03/21/16       Duration Progress to 30 minutes of continuous aerobic without signs/symptoms of physical distress       Intensity THRR unchanged         Progression   Progression Continue progressive overload as per policy without signs/symptoms or physical distress.       Average METs 2.9         Resistance Training   Training Prescription Yes       Weight 3lbs.       Reps 10-12         NuStep   Level 2       Minutes 10       METs 2.6         Track   Laps 13       Minutes 10       METs 3.26         Home Exercise Plan   Plans to continue exercise at Home  Reviewed on 03/21/16 see progress note       Frequency Add 3 additional days to program exercise sessions.          Exercise Comments:     Exercise Comments    Row Name 04/01/16 1215           Exercise Comments Reviewed METs and goals. Pt is tolerating exercise well; will continue to monitor exercise progression.          Discharge Exercise Prescription (Final Exercise Prescription Changes):  Exercise Prescription Changes - 04/01/16 1200      Exercise Review   Progression Yes     Response to Exercise   Blood Pressure (Admit) 152/78   Blood Pressure (Exercise) 162/80  recheck BP 122/60   Blood Pressure (Exit) 150/86   Heart Rate (Admit) 68 bpm   Heart Rate (Exercise) 89 bpm   Heart Rate (Exit) 82 bpm   Rating of  Perceived Exertion (Exercise) 11   Symptoms none   Comments Reviewed HEP on 03/21/16   Duration Progress to 30 minutes of continuous aerobic without signs/symptoms of physical distress   Intensity THRR unchanged     Progression   Progression Continue progressive overload as per policy without signs/symptoms or physical distress.   Average METs 2.9     Resistance Training   Training Prescription Yes   Weight 3lbs.   Reps 10-12     NuStep   Level 2   Minutes 10   METs 2.6     Track   Laps 13   Minutes 10   METs 3.26     Home Exercise Plan   Plans to continue exercise at Home  Reviewed on 03/21/16 see progress note   Frequency Add 3 additional days to program exercise sessions.      Nutrition:  Target Goals: Understanding of nutrition guidelines, daily intake of sodium <1545m, cholesterol <2063m calories 30% from fat and 7% or less from saturated fats, daily to have 5 or more servings of fruits and vegetables.  Biometrics:     Pre Biometrics - 02/26/16 1624      Pre Biometrics   Height 5' 5"  (1.651 m)   Weight 181 lb 3.5 oz (82.2 kg)   Waist Circumference 37.75 inches   Hip Circumference 38.5 inches   Waist to Hip Ratio 0.98 %   BMI (Calculated) 30.2   Triceps Skinfold 10 mm   % Body Fat 26.1 %   Grip Strength 57 kg   Flexibility 10.5 in   Single Leg Stand 30 seconds       Nutrition Therapy Plan and Nutrition Goals:     Nutrition Therapy & Goals - 03/05/16 1521      Nutrition Therapy   Diet Carb Modified, Therapeutic Lifestyle Changes     Personal Nutrition Goals   Personal Goal #1 1-2 lb wt loss/week to a wt loss goal of 6-24 lb at graduation from Cardiac Rehab   Personal Goal #2 Pt to have an understanding of DM diet     Intervention Plan   Intervention Prescribe, educate and counsel regarding individualized specific dietary modifications aiming towards targeted core components such as weight, hypertension, lipid management, diabetes, heart  failure and other comorbidities.   Expected Outcomes Short Term Goal: Understand basic principles of dietary content, such as calories, fat, sodium, cholesterol and nutrients.;Long Term Goal: Adherence to prescribed nutrition plan.      Nutrition Discharge: Nutrition Scores:   Nutrition Goals Re-Evaluation:   Psychosocial: Target Goals: Acknowledge presence or absence of depression, maximize coping skills, provide positive support system. Participant is able to verbalize types and ability to use techniques and skills needed for reducing stress and depression.  Initial Review & Psychosocial Screening:     Initial Psych Review & Screening - 02/26/16 [id number]     Barriers   Psychosocial barriers to participate in program There are no identifiable barriers or psychosocial needs.  Screening Interventions   Interventions Encouraged to exercise      Quality of Life Scores:     Quality of Life - 02/26/16 1623      Quality of Life Scores   Health/Function Pre 23.1 %   Socioeconomic Pre 23.83 %   Psych/Spiritual Pre 24.86 %   Family Pre 24 %   GLOBAL Pre 23.74 %      PHQ-9: Recent Review Flowsheet Data    Depression screen PHQ 2/9 03/05/2016   Decreased Interest 0   Down, Depressed, Hopeless 0   PHQ - 2 Score 0      Psychosocial Evaluation and Intervention:     Psychosocial Evaluation - 04/03/16 1752      Psychosocial Evaluation & Interventions   Interventions Encouraged to exercise with the program and follow exercise prescription   Comments Pt with suppportive family.  no needs identified   Continued Psychosocial Services Needed No      Psychosocial Re-Evaluation:     Psychosocial Re-Evaluation    [place] Name 03/06/16 1415 04/03/16 1752           Psychosocial Re-Evaluation   Interventions Encouraged to attend Cardiac Rehabilitation for the exercise;Relaxation education;Stress management education Encouraged  to attend Cardiac Rehabilitation for the exercise;Relaxation education;Stress management education      Comments  - Pt with some anxiety regarding BP elevation         Vocational Rehabilitation: Provide vocational rehab assistance to qualifying candidates.   Vocational Rehab Evaluation & Intervention:     Vocational Rehab - 02/26/16 1654      Initial Vocational Rehab Evaluation & Intervention   Assessment shows need for Vocational Rehabilitation No  Mr Ly is retired      Education: Education Goals: Education classes will be provided on a weekly basis, covering required topics. Participant will state understanding/return demonstration of topics presented.  Learning Barriers/Preferences:     Learning Barriers/Preferences - 02/26/16 1626      Learning Barriers/Preferences   Learning Barriers None;Sight   Learning Preferences Skilled Demonstration      Education Topics: Count Your Pulse:  -Group instruction provided by verbal instruction, demonstration, patient participation and written materials to support subject.  Instructors address importance of being able to find your pulse and how to count your pulse when at home without a heart monitor.  Patients get hands on experience counting their pulse with staff help and individually.   Heart Attack, Angina, and Risk Factor Modification:  -Group instruction provided by verbal instruction, video, and written materials to support subject.  Instructors address signs and symptoms of angina and heart attacks.    Also discuss risk factors for heart disease and how to make changes to improve heart health risk factors. Flowsheet Row CARDIAC REHAB PHASE II EXERCISE from 04/02/2016 in [facility]  Date  03/07/16  Educator  Barnet Pall, RN  Instruction Review Code  2- meets goals/outcomes      Functional Fitness:  -Group instruction provided by verbal instruction, demonstration, patient participation,  and written materials to support subject.  Instructors address safety measures for doing things around the house.  Discuss how to get up and down off the floor, how to pick things up properly, how to safely get out of a chair without assistance, and balance training.   Meditation and Mindfulness:  -Group instruction provided by verbal instruction, patient participation, and written materials to support subject.  Instructor addresses importance of mindfulness and meditation practice to help  reduce stress and improve awareness.  Instructor also leads participants through a meditation exercise.  Flowsheet Row CARDIAC REHAB PHASE II EXERCISE from 04/02/2016 in [facility]  Date  03/26/16  Instruction Review Code  2- meets goals/outcomes      Stretching for Flexibility and Mobility:  -Group instruction provided by verbal instruction, patient participation, and written materials to support subject.  Instructors lead participants through series of stretches that are designed to increase flexibility thus improving mobility.  These stretches are additional exercise for major muscle groups that are typically performed during regular warm up and cool down. Flowsheet Row CARDIAC REHAB PHASE II EXERCISE from 04/02/2016 in [facility]  Date  03/05/16  Instruction Review Code  2- meets goals/outcomes      Hands Only CPR Anytime:  -Group instruction provided by verbal instruction, video, patient participation and written materials to support subject.  Instructors co-teach with AHA video for hands only CPR.  Participants get hands on experience with mannequins.   Nutrition I class: Heart Healthy Eating:  -Group instruction provided by PowerPoint slides, verbal discussion, and written materials to support subject matter. The instructor gives an explanation and review of the Therapeutic Lifestyle Changes diet recommendations, which includes a discussion  on lipid goals, dietary fat, sodium, fiber, plant stanol/sterol esters, sugar, and the components of a well-balanced, healthy diet.   Nutrition II class: Lifestyle Skills:  -Group instruction provided by PowerPoint slides, verbal discussion, and written materials to support subject matter. The instructor gives an explanation and review of label reading, grocery shopping for heart health, heart healthy recipe modifications, and ways to make healthier choices when eating out.   Diabetes Question & Answer:  -Group instruction provided by PowerPoint slides, verbal discussion, and written materials to support subject matter. The instructor gives an explanation and review of diabetes co-morbidities, pre- and post-prandial blood glucose goals, pre-exercise blood glucose goals, signs, symptoms, and treatment of hypoglycemia and hyperglycemia, and foot care basics.   Diabetes Blitz:  -Group instruction provided by PowerPoint slides, verbal discussion, and written materials to support subject matter. The instructor gives an explanation and review of the physiology behind type 1 and type 2 diabetes, diabetes medications and rational behind using different medications, pre- and post-prandial blood glucose recommendations and Hemoglobin A1c goals, diabetes diet, and exercise including blood glucose guidelines for exercising safely.    Portion Distortion:  -Group instruction provided by PowerPoint slides, verbal discussion, written materials, and food models to support subject matter. The instructor gives an explanation of serving size versus portion size, changes in portions sizes over the last 20 years, and what consists of a serving from each food group. Flowsheet Row CARDIAC REHAB PHASE II EXERCISE from 04/02/2016 in [facility]  Date  04/02/16  Educator  RD  Instruction Review Code  2- meets goals/outcomes      Stress Management:  -Group instruction provided by verbal  instruction, video, and written materials to support subject matter.  Instructors review role of stress in heart disease and how to cope with stress positively.     Exercising on Your Own:  -Group instruction provided by verbal instruction, power point, and written materials to support subject.  Instructors discuss benefits of exercise, components of exercise, frequency and intensity of exercise, and end points for exercise.  Also discuss use of nitroglycerin and activating EMS.  Review options of places to exercise outside of rehab.  Review guidelines for sex  with heart disease.   Cardiac Drugs I:  -Group instruction provided by verbal instruction and written materials to support subject.  Instructor reviews cardiac drug classes: antiplatelets, anticoagulants, beta blockers, and statins.  Instructor discusses reasons, side effects, and lifestyle considerations for each drug class. Flowsheet Row CARDIAC REHAB PHASE II EXERCISE from 04/02/2016 in [facility]  Date  03/12/16  Educator  Pharmacist  Instruction Review Code  2- meets goals/outcomes      Cardiac Drugs II:  -Group instruction provided by verbal instruction and written materials to support subject.  Instructor reviews cardiac drug classes: angiotensin converting enzyme inhibitors (ACE-I), angiotensin II receptor blockers (ARBs), nitrates, and calcium channel blockers.  Instructor discusses reasons, side effects, and lifestyle considerations for each drug class.   Anatomy and Physiology of the Circulatory System:  -Group instruction provided by verbal instruction, video, and written materials to support subject.  Reviews functional anatomy of heart, how it relates to various diagnoses, and what role the heart plays in the overall system. Flowsheet Row CARDIAC REHAB PHASE II EXERCISE from 04/02/2016 in [facility]  Date  03/19/16  Instruction Review Code  2- meets goals/outcomes       Knowledge Questionnaire Score:     Knowledge Questionnaire Score - 03/05/16 1551      Knowledge Questionnaire Score   Pre Score 24/28     DM 15/15      Core Components/Risk Factors/Patient Goals at Admission:     Personal Goals and Risk Factors at Admission - 02/26/16 1616      Core Components/Risk Factors/Patient Goals on Admission    Weight Management Yes;Obesity;Weight Loss   Admit Weight 181 lb 3.5 oz (82.2 kg)   Goal Weight: Short Term 176 lb (79.8 kg)   Goal Weight: Long Term 171 lb (77.6 kg)   Expected Outcomes Short Term: Continue to assess and modify interventions until short term weight is achieved;Long Term: Adherence to nutrition and physical activity/exercise program aimed toward attainment of established weight goal;Weight Loss: Understanding of general recommendations for a balanced deficit meal plan, which promotes 1-2 lb weight loss per week and includes a negative energy balance of [phone] kcal/d;Understanding recommendations for meals to include 15-35% energy as protein, 25-35% energy from fat, 35-60% energy from carbohydrates, less than 23m of dietary cholesterol, 20-35 gm of total fiber daily;Understanding of distribution of calorie intake throughout the day with the consumption of 4-5 meals/snacks   Sedentary Yes   Intervention Provide advice, education, support and counseling about physical activity/exercise needs.;Develop an individualized exercise prescription for aerobic and resistive training based on initial evaluation findings, risk stratification, comorbidities and participant's personal goals.   Expected Outcomes Achievement of increased cardiorespiratory fitness and enhanced flexibility, muscular endurance and strength shown through measurements of functional capacity and personal statement of participant.   Increase Strength and Stamina Yes   Intervention Provide advice, education, support and counseling about physical activity/exercise  needs.;Develop an individualized exercise prescription for aerobic and resistive training based on initial evaluation findings, risk stratification, comorbidities and participant's personal goals.   Expected Outcomes Achievement of increased cardiorespiratory fitness and enhanced flexibility, muscular endurance and strength shown through measurements of functional capacity and personal statement of participant.   Tobacco Cessation Yes  Quit on 10/29/15.   Number of packs per day 1   Hypertension Yes   Intervention Provide education on lifestyle modifcations including regular physical activity/exercise, weight management, moderate sodium restriction and increased consumption of fresh fruit, vegetables,  and low fat dairy, alcohol moderation, and smoking cessation.;Monitor prescription use compliance.   Expected Outcomes Short Term: Continued assessment and intervention until BP is < 140/81m HG in hypertensive participants. < 130/862mHG in hypertensive participants with diabetes, heart failure or chronic kidney disease.;Long Term: Maintenance of blood pressure at goal levels.   Lipids Yes   Intervention Provide education and support for participant on nutrition & aerobic/resistive exercise along with prescribed medications to achieve LDL <7076mHDL >52m70m Expected Outcomes Short Term: Participant states understanding of desired cholesterol values and is compliant with medications prescribed. Participant is following exercise prescription and nutrition guidelines.;Long Term: Cholesterol controlled with medications as prescribed, with individualized exercise RX and with personalized nutrition plan. Value goals: LDL < 70mg65mL > 40 mg.   Personal Goal Other Yes   Personal Goal Increase upper body strength.   Intervention Progress upper body hand weights and initiate appropriate strength training program to promote increased upper body strength.   Expected Outcomes Increased upper body strength as  measured by grip strength measurement.      Core Components/Risk Factors/Patient Goals Review:      Goals and Risk Factor Review    Row Name 04/01/16 1216             Core Components/Risk Factors/Patient Goals Review   Personal Goals Review Increase Strength and Stamina       Review Pt is compliant with HEP and exercises 3x/week. Pt is walking for exercise. Pt is also raking leaves and other yardwork activities without difficulty.       Expected Outcomes Pt will continue to perform ADL's and other activities without difficulty.          Core Components/Risk Factors/Patient Goals at Discharge (Final Review):      Goals and Risk Factor Review - 04/01/16 1216      Core Components/Risk Factors/Patient Goals Review   Personal Goals Review Increase Strength and Stamina   Review Pt is compliant with HEP and exercises 3x/week. Pt is walking for exercise. Pt is also raking leaves and other yardwork activities without difficulty.   Expected Outcomes Pt will continue to perform ADL's and other activities without difficulty.      ITP Comments:     ITP Comments    Row Name 02/26/16 1333 03/28/16 1133         ITP Comments Medical Director- Dr. TraciFransico Him Attended CPR education class; goals and outcomes met.         Comments:  Pt is making expected progress toward personal goals after completing 10 sessions. Dr. TilleWynonia Lawmanorking on improving bp management.  Pt has upcoming appt on Monday for follow up.  Pt unable to tolerated Lisinopril 10 mg.  This medication gave him a cough.Repeat Psychosocial Assessment: Pt with supportive wife, denies any Psychosocial needs or interventions at this time.  Pt did verbalize some anxiety regarding his blood pressure.  Pt is hopeful his bp will improve with changing his medication regimen. Recommend continued exercise and life style modification education including  stress management and relaxation techniques to decrease cardiac risk profile.  CarleCherre Huger

## 2016-04-04 ENCOUNTER — Encounter (HOSPITAL_COMMUNITY): Payer: Medicare Other

## 2016-04-07 ENCOUNTER — Encounter (HOSPITAL_COMMUNITY): Payer: Medicare Other

## 2016-04-08 ENCOUNTER — Ambulatory Visit (HOSPITAL_COMMUNITY)
Admission: RE | Admit: 2016-04-08 | Discharge: 2016-04-08 | Disposition: A | Discharge status: Home / Self Care | Payer: Medicare Other | Source: Ambulatory Visit | Attending: Cardiothoracic Surgery | Admitting: Cardiothoracic Surgery

## 2016-04-08 DIAGNOSIS — M12811 Other specific arthropathies, not elsewhere classified, right shoulder: Secondary | ICD-10-CM | POA: Diagnosis not present

## 2016-04-08 DIAGNOSIS — K5730 Diverticulosis of large intestine without perforation or abscess without bleeding: Secondary | ICD-10-CM | POA: Insufficient documentation

## 2016-04-08 DIAGNOSIS — I708 Atherosclerosis of other arteries: Secondary | ICD-10-CM | POA: Diagnosis not present

## 2016-04-08 DIAGNOSIS — I700 Atherosclerosis of aorta: Secondary | ICD-10-CM | POA: Insufficient documentation

## 2016-04-08 DIAGNOSIS — I2510 Atherosclerotic heart disease of native coronary artery without angina pectoris: Secondary | ICD-10-CM | POA: Insufficient documentation

## 2016-04-08 DIAGNOSIS — R938 Abnormal findings on diagnostic imaging of other specified body structures: Secondary | ICD-10-CM | POA: Insufficient documentation

## 2016-04-08 DIAGNOSIS — R911 Solitary pulmonary nodule: Secondary | ICD-10-CM

## 2016-04-08 LAB — GLUCOSE, CAPILLARY: Glucose-Capillary: 100 mg/dL — ABNORMAL HIGH (ref 65–99)

## 2016-04-08 MED ORDER — FLUDEOXYGLUCOSE F - 18 (FDG) INJECTION
9.0000 | Freq: Once | INTRAVENOUS | Status: AC | PRN
  Administered 2016-04-08: 9 via INTRAVENOUS

## 2016-04-09 ENCOUNTER — Encounter (HOSPITAL_COMMUNITY)
Admission: RE | Admit: 2016-04-09 | Discharge: 2016-04-09 | Disposition: A | Discharge status: Home / Self Care | Payer: Medicare Other | Source: Ambulatory Visit | Attending: Cardiology | Admitting: Cardiology

## 2016-04-09 DIAGNOSIS — I214 Non-ST elevation (NSTEMI) myocardial infarction: Secondary | ICD-10-CM | POA: Diagnosis not present

## 2016-04-09 DIAGNOSIS — Z951 Presence of aortocoronary bypass graft: Secondary | ICD-10-CM

## 2016-04-10 ENCOUNTER — Encounter: Payer: Self-pay | Admitting: Cardiothoracic Surgery

## 2016-04-10 ENCOUNTER — Other Ambulatory Visit: Payer: Self-pay

## 2016-04-10 ENCOUNTER — Ambulatory Visit (INDEPENDENT_AMBULATORY_CARE_PROVIDER_SITE_OTHER): Payer: Medicare Other | Admitting: Cardiothoracic Surgery

## 2016-04-10 VITALS — BP 117/71 | HR 64 | Resp 16 | Ht 65.0 in | Wt 180.0 lb

## 2016-04-10 DIAGNOSIS — D381 Neoplasm of uncertain behavior of trachea, bronchus and lung: Secondary | ICD-10-CM | POA: Diagnosis not present
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
## 2016-04-11 ENCOUNTER — Encounter (HOSPITAL_COMMUNITY)
Admission: RE | Admit: 2016-04-11 | Discharge: 2016-04-11 | Disposition: A | Discharge status: Home / Self Care | Payer: Medicare Other | Source: Ambulatory Visit | Attending: Cardiology | Admitting: Cardiology

## 2016-04-11 DIAGNOSIS — Z951 Presence of aortocoronary bypass graft: Secondary | ICD-10-CM

## 2016-04-11 DIAGNOSIS — I214 Non-ST elevation (NSTEMI) myocardial infarction: Secondary | ICD-10-CM | POA: Diagnosis not present

## 2016-04-14 ENCOUNTER — Encounter (HOSPITAL_COMMUNITY): Payer: Medicare Other

## 2016-04-16 ENCOUNTER — Encounter (HOSPITAL_COMMUNITY): Payer: Medicare Other

## 2016-04-18 ENCOUNTER — Encounter (HOSPITAL_COMMUNITY): Payer: Medicare Other

## 2016-04-21 ENCOUNTER — Encounter (HOSPITAL_COMMUNITY): Payer: Medicare Other

## 2016-04-23 ENCOUNTER — Encounter (HOSPITAL_COMMUNITY)
Admission: RE | Admit: 2016-04-23 | Discharge: 2016-04-23 | Disposition: A | Discharge status: Home / Self Care | Payer: Medicare Other | Source: Ambulatory Visit | Attending: Cardiology | Admitting: Cardiology

## 2016-04-23 DIAGNOSIS — I214 Non-ST elevation (NSTEMI) myocardial infarction: Secondary | ICD-10-CM | POA: Diagnosis not present

## 2016-04-23 DIAGNOSIS — Z951 Presence of aortocoronary bypass graft: Secondary | ICD-10-CM

## 2016-04-25 ENCOUNTER — Encounter (HOSPITAL_COMMUNITY)
Admission: RE | Admit: 2016-04-25 | Discharge: 2016-04-25 | Disposition: A | Discharge status: Home / Self Care | Payer: Medicare Other | Source: Ambulatory Visit | Attending: Cardiology | Admitting: Cardiology

## 2016-04-25 DIAGNOSIS — I214 Non-ST elevation (NSTEMI) myocardial infarction: Secondary | ICD-10-CM | POA: Diagnosis not present

## 2016-04-25 NOTE — Progress Notes (Signed)
Discharge Summary  Patient Details  Name: Gabriel [name] MRN: [id number] Date of Birth: 07-30-1945 Referring Provider:   Flowsheet Row CARDIAC REHAB PHASE II ORIENTATION from 02/26/2016 in [facility]  Referring Provider  Jacolyn Reedy., MD.       Number of Visits: 14  Reason for Discharge:  Patient reached a stable level of exercise. Patient independent in their exercise.  Smoking History:  History  Smoking Status  . Former Smoker  . Packs/day: 1.00  . Years: 50.00  . Types: Cigarettes  . Quit date: 10/29/2015  Smokeless Tobacco  . Never Used    Diagnosis:  No diagnosis found.  ADL UCSD:   Initial Exercise Prescription:     Initial Exercise Prescription - 02/26/16 1600      Date of Initial Exercise RX and Referring Provider   Date 02/26/16   Referring Provider Jacolyn Reedy., MD.     Bike   Level 0.8   Minutes 10   METs 2.84     NuStep   Level 2   Minutes 10   METs 2.8     Track   Laps 11   Minutes 10   METs 2.92     Prescription Details   Frequency (times per week) 3   Duration Progress to 30 minutes of continuous aerobic without signs/symptoms of physical distress     Intensity   THRR 40-80% of Max Heartrate 61-122   Ratings of Perceived Exertion 11-13   Perceived Dyspnea 0-4     Progression   Progression Continue to progress workloads to maintain intensity without signs/symptoms of physical distress.     Resistance Training   Training Prescription Yes   Weight 3lbs.   Reps 10-12      Discharge Exercise Prescription (Final Exercise Prescription Changes):     Exercise Prescription Changes - 04/25/16 1118      Exercise Review   Progression Yes     Response to Exercise   Blood Pressure (Admit) 142/84   Blood Pressure (Exercise) 148/80   Blood Pressure (Exit) 148/82   Heart Rate (Admit) 79 bpm   Heart Rate (Exercise) 120 bpm   Heart Rate (Exit) 79 bpm   Rating of Perceived Exertion  (Exercise) 12   Symptoms none   Comments Reviewed HEP on 03/21/16   Duration Progress to 30 minutes of continuous aerobic without signs/symptoms of physical distress   Intensity THRR unchanged     Progression   Progression Continue progressive overload as per policy without signs/symptoms or physical distress.   Average METs 2.9     Resistance Training   Training Prescription Yes   Weight 5lbs   Reps 10-12     Bike   Level 1   Minutes 10   METs 3.25     NuStep   Level 2   Minutes 10   METs 2.8     Track   Laps 10   Minutes 10   METs 2.74     Home Exercise Plan   Plans to continue exercise at [facility] on 03/21/16   Frequency Add 3 additional days to program exercise sessions.      Functional Capacity:     6 Minute Walk    Row Name 02/26/16 1621 05/07/16 0819 05/07/16 1125     6 Minute Walk   Phase Initial (P)  Discharge Discharge   Distance 1326 feet (P)  1505 feet 1505 feet   Distance % Change  -  -  13.5 %   Walk Time 6 minutes (P)  6 minutes 6 minutes   # of Rest Breaks 0 (P)  0 0   MPH 2.51  - 2.85   METS 2.95  - 3.35   RPE 11 (P)  13 13   VO2 Peak 10.32  - 11.75   Symptoms Yes (comment) (P)  Yes (comment) Yes (comment)   Comments Right calf tightness. (P)  Right calf tightness. Right calf tightness.   Resting HR 78 bpm (P)  82 bpm 82 bpm   Resting BP 142/68 (P)  142/82 142/82   Max Ex. HR 98 bpm (P)  110 bpm 110 bpm   Max Ex. BP 144/82 (P)  150/84 150/84   2 Minute Post BP 124/70  - 142/70      Psychological, QOL, Others - Outcomes: PHQ 2/9: Depression screen [facility] 2/9 04/25/2016 03/05/2016  Decreased Interest 0 0  Down, Depressed, Hopeless 0 0  PHQ - 2 Score 0 0    Quality of Life:     Quality of Life - 05/07/16 1121      Quality of Life Scores   Health/Function Pre 23.1 %   Health/Function Post 26.03 %   Health/Function % Change 12.68 %   Socioeconomic Pre 23.83 %   Socioeconomic Post 25.57 %   Socioeconomic % Change  7.3 %    Psych/Spiritual Pre 24.86 %   Psych/Spiritual Post 25.93 %   Psych/Spiritual % Change 4.3 %   Family Pre 24 %   Family Post 25.9 %   Family % Change 7.92 %   GLOBAL Pre 23.74 %   GLOBAL Post 25.92 %   GLOBAL % Change 9.18 %      Personal Goals: Goals established at orientation with interventions provided to work toward goal.     Personal Goals and Risk Factors at Admission - 02/26/16 1616      Core Components/Risk Factors/Patient Goals on Admission    Weight Management Yes;Obesity;Weight Loss   Admit Weight 181 lb 3.5 oz (82.2 kg)   Goal Weight: Short Term 176 lb (79.8 kg)   Goal Weight: Long Term 171 lb (77.6 kg)   Expected Outcomes Short Term: Continue to assess and modify interventions until short term weight is achieved;Long Term: Adherence to nutrition and physical activity/exercise program aimed toward attainment of established weight goal;Weight Loss: Understanding of general recommendations for a balanced deficit meal plan, which promotes 1-2 lb weight loss per week and includes a negative energy balance of [phone] kcal/d;Understanding recommendations for meals to include 15-35% energy as protein, 25-35% energy from fat, 35-60% energy from carbohydrates, less than 259m of dietary cholesterol, 20-35 gm of total fiber daily;Understanding of distribution of calorie intake throughout the day with the consumption of 4-5 meals/snacks   Sedentary Yes   Intervention Provide advice, education, support and counseling about physical activity/exercise needs.;Develop an individualized exercise prescription for aerobic and resistive training based on initial evaluation findings, risk stratification, comorbidities and participant's personal goals.   Expected Outcomes Achievement of increased cardiorespiratory fitness and enhanced flexibility, muscular endurance and strength shown through measurements of functional capacity and personal statement of participant.   Increase Strength and  Stamina Yes   Intervention Provide advice, education, support and counseling about physical activity/exercise needs.;Develop an individualized exercise prescription for aerobic and resistive training based on initial evaluation findings, risk stratification, comorbidities and participant's personal goals.   Expected Outcomes Achievement of increased cardiorespiratory fitness and enhanced flexibility, muscular endurance and strength shown  through measurements of functional capacity and personal statement of participant.   Tobacco Cessation Yes  Quit on 10/29/15.   Number of packs per day 1   Hypertension Yes   Intervention Provide education on lifestyle modifcations including regular physical activity/exercise, weight management, moderate sodium restriction and increased consumption of fresh fruit, vegetables, and low fat dairy, alcohol moderation, and smoking cessation.;Monitor prescription use compliance.   Expected Outcomes Short Term: Continued assessment and intervention until BP is < 140/75m HG in hypertensive participants. < 130/826mHG in hypertensive participants with diabetes, heart failure or chronic kidney disease.;Long Term: Maintenance of blood pressure at goal levels.   Lipids Yes   Intervention Provide education and support for participant on nutrition & aerobic/resistive exercise along with prescribed medications to achieve LDL <7059mHDL >60m49m Expected Outcomes Short Term: Participant states understanding of desired cholesterol values and is compliant with medications prescribed. Participant is following exercise prescription and nutrition guidelines.;Long Term: Cholesterol controlled with medications as prescribed, with individualized exercise RX and with personalized nutrition plan. Value goals: LDL < 70mg20mL > 40 mg.   Personal Goal Other Yes   Personal Goal Increase upper body strength.   Intervention Progress upper body hand weights and initiate appropriate strength training  program to promote increased upper body strength.   Expected Outcomes Increased upper body strength as measured by grip strength measurement.       Personal Goals Discharge:     Goals and Risk Factor Review    Row Name 04/01/16 1216 05/07/16 1128 05/07/16 1129 05/08/16 1405       Core Components/Risk Factors/Patient Goals Review   Personal Goals Review Increase Strength and Stamina  -  - Weight Management/Obesity    Review Pt is compliant with HEP and exercises 3x/week. Pt is walking for exercise. Pt is also raking leaves and other yardwork activities without difficulty. Pt encouraged to walk for exercise at home 3-4x/week and continue with lifestyle modifications to decrease future occurrences.  - Pt with 8.8 lb wt gain. Wt loss goal not met.    Expected Outcomes Pt will continue to perform ADL's and other activities without difficulty. Pt will be compliant with HEP  Pt will be compliant with HEP and continue with improving strength and overall fitness levels Continue Heart Healthy diet and lifestyle changes to promote desired wt loss.        Nutrition & Weight - Outcomes:     Pre Biometrics - 02/26/16 1624      Pre Biometrics   Height 5' 5"  (1.651 m)   Weight 181 lb 3.5 oz (82.2 kg)   Waist Circumference 37.75 inches   Hip Circumference 38.5 inches   Waist to Hip Ratio 0.98 %   BMI (Calculated) 30.2   Triceps Skinfold 10 mm   % Body Fat 26.1 %   Grip Strength 57 kg   Flexibility 10.5 in   Single Leg Stand 30 seconds         Post Biometrics - 05/07/16 1122       Post  Biometrics   Height 5' 5"  (1.651 m)   Weight 190 lb 0.6 oz (86.2 kg)   Waist Circumference 39.25 inches   Hip Circumference 40 inches   Waist to Hip Ratio 0.98 %   BMI (Calculated) 31.7   Triceps Skinfold 11 mm   % Body Fat 27.8 %   Grip Strength 57 kg   Flexibility 8 in   Single Leg Stand 30 seconds  Nutrition:     Nutrition Therapy & Goals - 03/05/16 1521      Nutrition Therapy    Diet Carb Modified, Therapeutic Lifestyle Changes     Personal Nutrition Goals   Personal Goal #1 1-2 lb wt loss/week to a wt loss goal of 6-24 lb at graduation from Cardiac Rehab   Personal Goal #2 Pt to have an understanding of DM diet     Intervention Plan   Intervention Prescribe, educate and counsel regarding individualized specific dietary modifications aiming towards targeted core components such as weight, hypertension, lipid management, diabetes, heart failure and other comorbidities.   Expected Outcomes Short Term Goal: Understand basic principles of dietary content, such as calories, fat, sodium, cholesterol and nutrients.;Long Term Goal: Adherence to prescribed nutrition plan.      Nutrition Discharge:     Nutrition Assessments - 05/08/16 1404      MEDFICTS Scores   Pre Score 9   Post Score 3   Score Difference -6      Education Questionnaire Score:     Knowledge Questionnaire Score - 05/07/16 0818      Knowledge Questionnaire Score   Post Score 23/24      Goals reviewed with patient. Pt graduated from cardiac rehab program today with completion of 14 exercise sessions in Phase II.Pt elected to graduate early due to high copay. Pt maintained good attendance and progressed nicely during his participation in rehab as evidenced by increased MET level.   Medication list reconciled.  Psychosocial Assessment Repeat  PHQ score-0.  Pt completed post assessment quality of life survey.  Pt scored the following     Quality of Life - 05/07/16 1121      Quality of Life Scores   Health/Function Pre 23.1 %   Health/Function Post 26.03 %   Health/Function % Change 12.68 %   Socioeconomic Pre 23.83 %   Socioeconomic Post 25.57 %   Socioeconomic % Change  7.3 %   Psych/Spiritual Pre 24.86 %   Psych/Spiritual Post 25.93 %   Psych/Spiritual % Change 4.3 %   Family Pre 24 %   Family Post 25.9 %   Family % Change 7.92 %   GLOBAL Pre 23.74 %   GLOBAL Post 25.92 %   GLOBAL  % Change 9.18 %       Pt has made significant lifestyle changes and should be commended for his success. Pt desires more information regarding heart healthy diet.  Will have Parke Simmers, registered dietician reach out to patient to answer any remaining questions he has.  Pt given a copy of the group nutrition classes on Tuesdays .Pt feels he has achieved his goals during cardiac rehab. Pt feels has increased upper body strength. Pt has not lost the desired weight but has maintained his weight.  Pt plans to continue exercise with walking every day treadmill and in the neighborhood weather permitting. Pt would benefit from close monitoring of his blood pressure particularly since he did not complete 12 weeks as these were not quite at goal but showed some improvement. It was a delight to have this patient in cardiac rehab. Cherre Huger, BSN

## 2016-04-28 ENCOUNTER — Encounter (HOSPITAL_COMMUNITY): Payer: Medicare Other

## 2016-04-30 ENCOUNTER — Encounter (HOSPITAL_COMMUNITY): Payer: Medicare Other

## 2016-05-02 ENCOUNTER — Encounter (HOSPITAL_COMMUNITY): Payer: Medicare Other

## 2016-05-05 ENCOUNTER — Encounter (HOSPITAL_COMMUNITY): Payer: Medicare Other

## 2016-05-07 ENCOUNTER — Encounter (HOSPITAL_COMMUNITY): Payer: Medicare Other

## 2016-05-07 NOTE — Addendum Note (Signed)
Encounter addended by: Christion Leonhard D Roya Gieselman on: 05/07/2016  8:34 AM<BR>    Actions taken: Flowsheet data copied forward, Visit Navigator Flowsheet section accepted

## 2016-05-08 ENCOUNTER — Telehealth (HOSPITAL_COMMUNITY): Payer: Self-pay | Admitting: *Deleted

## 2016-05-09 ENCOUNTER — Encounter (HOSPITAL_COMMUNITY): Payer: Medicare Other

## 2016-05-09 ENCOUNTER — Telehealth (HOSPITAL_COMMUNITY): Payer: Self-pay | Admitting: *Deleted

## 2016-05-09 NOTE — Progress Notes (Signed)
Gabriel [name] 71 y.o. male Nutrition Note Spoke with pt and his wife over the phone. Nutrition Plan and Nutrition Survey goals reviewed with pt. Pt is following Step 2 of the Therapeutic Lifestyle Changes diet. Pt's A1c was noted to be 6.6 and 6.4 12/27/15. Per discussion with pt and pt's wife, A1c was re-checked at his PCP office in October and November. No updated A1c noted. Pre-diabetes and heart disease discussed. Pt expressed understanding of the information reviewed. Pt aware of nutrition education classes offered and plans on attending nutrition classes.  No results found for: HGBA1C Wt Readings from Last 3 Encounters:  05/07/16 190 lb 0.6 oz (86.2 kg)  04/10/16 180 lb (81.6 kg)  03/06/16 180 lb (81.6 kg)    Nutrition Diagnosis ? Food-and nutrition-related knowledge deficit related to lack of exposure to information as related to diagnosis of: ? CVD ? Pre-DM ? Obesity related to excessive energy intake as evidenced by a BMI of 30.2  Nutrition Intervention ? Pt's individual nutrition plan reviewed with pt. ? Benefits of adopting Therapeutic Lifestyle Changes discussed when Medficts reviewed. ? Pt to attend the Portion Distortion class - met 04/02/16 ? Pt to attend the   ? Nutrition I class                  ? Nutrition II class   ? Will mail Nutrition class schedule to pt ? Pt given handouts for: ? Nutrition I class ? Nutrition II class ? Continue client-centered nutrition education by RD, as part of interdisciplinary care.  Monitor and Evaluate progress toward nutrition goal with team. Derek Mound, M.Ed, RD, LDN, CDE 05/09/2016 12:17 PM

## 2016-05-12 ENCOUNTER — Encounter (HOSPITAL_COMMUNITY): Payer: Medicare Other

## 2016-05-14 ENCOUNTER — Encounter (HOSPITAL_COMMUNITY): Payer: Medicare Other

## 2016-05-16 ENCOUNTER — Encounter (HOSPITAL_COMMUNITY): Payer: Medicare Other

## 2016-05-19 ENCOUNTER — Encounter (HOSPITAL_COMMUNITY): Payer: Medicare Other

## 2016-05-21 ENCOUNTER — Encounter (HOSPITAL_COMMUNITY): Payer: Medicare Other

## 2016-05-23 ENCOUNTER — Encounter (HOSPITAL_COMMUNITY): Payer: Medicare Other

## 2016-05-26 ENCOUNTER — Encounter (HOSPITAL_COMMUNITY): Payer: Medicare Other

## 2016-05-28 ENCOUNTER — Encounter (HOSPITAL_COMMUNITY): Payer: Medicare Other

## 2016-05-30 ENCOUNTER — Encounter (HOSPITAL_COMMUNITY): Payer: Medicare Other

## 2016-06-02 ENCOUNTER — Encounter (HOSPITAL_COMMUNITY): Payer: Medicare Other

## 2016-06-03 NOTE — Addendum Note (Signed)
Encounter addended by: Jewel Baize, RD on: 06/03/2016 12:18 PM<BR>    Actions taken: Flowsheet data copied forward, Flowsheet accepted

## 2016-06-04 ENCOUNTER — Encounter (HOSPITAL_COMMUNITY): Payer: Medicare Other

## 2016-06-06 ENCOUNTER — Encounter (HOSPITAL_COMMUNITY): Payer: Medicare Other

## 2016-06-09 ENCOUNTER — Encounter (HOSPITAL_COMMUNITY): Payer: Medicare Other

## 2016-06-11 ENCOUNTER — Encounter (HOSPITAL_COMMUNITY): Payer: Medicare Other

## 2016-06-13 ENCOUNTER — Encounter (HOSPITAL_COMMUNITY): Payer: Medicare Other

## 2016-06-16 ENCOUNTER — Encounter (HOSPITAL_COMMUNITY): Payer: Medicare Other

## 2016-06-18 ENCOUNTER — Encounter (HOSPITAL_COMMUNITY): Payer: Medicare Other

## 2016-06-20 ENCOUNTER — Encounter (HOSPITAL_COMMUNITY): Payer: Medicare Other

## 2016-06-23 ENCOUNTER — Encounter (HOSPITAL_COMMUNITY): Payer: Medicare Other

## 2016-06-25 ENCOUNTER — Encounter (HOSPITAL_COMMUNITY): Payer: Medicare Other

## 2016-06-27 ENCOUNTER — Encounter (HOSPITAL_COMMUNITY): Payer: Medicare Other

## 2016-06-30 ENCOUNTER — Encounter (HOSPITAL_COMMUNITY): Payer: Medicare Other

## 2016-09-09 ENCOUNTER — Other Ambulatory Visit: Payer: Self-pay | Admitting: *Deleted

## 2016-09-09 DIAGNOSIS — R911 Solitary pulmonary nodule: Secondary | ICD-10-CM

## 2016-10-16 ENCOUNTER — Other Ambulatory Visit: Payer: Medicare Other

## 2016-10-16 ENCOUNTER — Ambulatory Visit: Payer: Medicare Other | Admitting: Cardiothoracic Surgery

## 2016-11-13 ENCOUNTER — Other Ambulatory Visit: Payer: Medicare Other

## 2016-11-13 ENCOUNTER — Ambulatory Visit: Payer: Medicare Other | Admitting: Cardiothoracic Surgery

## 2016-12-11 ENCOUNTER — Ambulatory Visit
Admission: RE | Admit: 2016-12-11 | Discharge: 2016-12-11 | Disposition: A | Discharge status: Home / Self Care | Payer: Medicare Other | Source: Ambulatory Visit | Attending: Cardiothoracic Surgery | Admitting: Cardiothoracic Surgery

## 2016-12-11 ENCOUNTER — Other Ambulatory Visit: Payer: Self-pay | Admitting: *Deleted

## 2016-12-11 ENCOUNTER — Encounter: Payer: Self-pay | Admitting: Cardiothoracic Surgery

## 2016-12-11 ENCOUNTER — Ambulatory Visit (INDEPENDENT_AMBULATORY_CARE_PROVIDER_SITE_OTHER): Payer: Medicare Other | Admitting: Cardiothoracic Surgery

## 2016-12-11 VITALS — BP 145/84 | HR 66 | Resp 16 | Ht 65.0 in | Wt 184.0 lb

## 2016-12-11 DIAGNOSIS — Z951 Presence of aortocoronary bypass graft: Secondary | ICD-10-CM | POA: Diagnosis not present

## 2016-12-11 DIAGNOSIS — R911 Solitary pulmonary nodule: Secondary | ICD-10-CM

## 2016-12-11 DIAGNOSIS — D381 Neoplasm of uncertain behavior of trachea, bronchus and lung: Secondary | ICD-10-CM

## 2016-12-11 DIAGNOSIS — I739 Peripheral vascular disease, unspecified: Secondary | ICD-10-CM
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
## 2017-01-15 ENCOUNTER — Encounter: Payer: Medicare Other | Admitting: Vascular Surgery

## 2017-01-15 ENCOUNTER — Encounter (HOSPITAL_COMMUNITY): Payer: Medicare Other

## 2017-01-21 ENCOUNTER — Ambulatory Visit (HOSPITAL_COMMUNITY)
Admission: RE | Admit: 2017-01-21 | Discharge: 2017-01-21 | Disposition: A | Discharge status: Home / Self Care | Payer: Medicare Other | Source: Ambulatory Visit | Attending: Cardiothoracic Surgery | Admitting: Cardiothoracic Surgery

## 2017-01-21 ENCOUNTER — Ambulatory Visit (INDEPENDENT_AMBULATORY_CARE_PROVIDER_SITE_OTHER): Payer: Medicare Other | Admitting: Vascular Surgery

## 2017-01-21 ENCOUNTER — Encounter: Payer: Self-pay | Admitting: Vascular Surgery

## 2017-01-21 VITALS — BP 130/82 | HR 70 | Temp 97.9°F | Resp 20 | Ht 65.0 in | Wt 185.0 lb

## 2017-01-21 DIAGNOSIS — I70219 Atherosclerosis of native arteries of extremities with intermittent claudication, unspecified extremity: Secondary | ICD-10-CM

## 2017-01-21 DIAGNOSIS — I739 Peripheral vascular disease, unspecified: Secondary | ICD-10-CM | POA: Diagnosis not present

## 2017-01-21 DIAGNOSIS — I70202 Unspecified atherosclerosis of native arteries of extremities, left leg: Secondary | ICD-10-CM | POA: Insufficient documentation

## 2017-01-21 LAB — VAS US LOWER EXTREMITY ARTERIAL DUPLEX
RIGHT ANT DIST TIBAL SYS PSV: 11 cm/s
RIGHT POST TIB DIST SYS: 20 cm/s
Right super femoral dist sys PSV: -27 cm/s
Right super femoral mid sys PSV: -154 cm/s
Right super femoral prox sys PSV: 117 cm/s

## 2017-01-21 NOTE — Progress Notes (Signed)
Patient name: Gabriel [name] MRN: [id number] DOB: 1945-05-02 Sex: male   REASON FOR CONSULT:    Right lower extremity claudication.  Consult is requested by Dr. Servando Snare.  HPI:   Gabriel [name] is a pleasant 70 y.o. male, who is referred for evaluation of right lower extremity claudication.  He had a myocardial infarction in October 2017 and underwent coronary revascularization in [facility].  He tells me that after he was discharged and went home he began noticing pain in the right calf which was brought on by ambulation and relieved with rest.  This occurred at approximately 1/8 of a mile.  There were no other aggravating or alleviating factors.  He denied any left lower extremity claudication.  He denies any hip or thigh claudication.  He denies rest pain or history of nonhealing ulcers.  I have reviewed the records from Dr. Everrett Coombe office.  The patient has undergone previous coronary revascularization in [facility].  The patient was seen on 12/11/2016 and was complaining of right calf claudication.  Of note the patient had required an intra-aortic balloon pump on that side.  The patient was being seen for a potential lung mass.  CT scan of the chest that suggested a 1.1 cm left upper lobe nodule.  A follow-up CT scan in 6 months was recommended.  His risk factors for peripheral vascular disease include hypertension, hypercholesterolemia, and a history of tobacco use.  (He quit in July 2017).  Past Medical History:  Diagnosis Date  . CAD (coronary artery disease)   . Hyperlipidemia   . Hypertension   . MI (myocardial infarction) ([facility])     History reviewed. No pertinent family history.  There is no family history of premature cardiovascular disease.  SOCIAL HISTORY: He quit smoking in July 2017. Social History   Social History  . Marital status: Married    Spouse name: N/A  . Number of children: N/A  . Years of education: N/A   Occupational History    . Not on file.   Social History Main Topics  . Smoking status: Former Smoker    Packs/day: 1.00    Years: 50.00    Types: Cigarettes    Quit date: 10/29/2015  . Smokeless tobacco: Never Used  . Alcohol use No  . Drug use: No  . Sexual activity: Not on file   Other Topics Concern  . Not on file   Social History Narrative  . No narrative on file    Allergies  Allergen Reactions  . Iodinated Diagnostic Agents Other (See Comments)    Unknown to patient, was told about this post-op.   Marland Kitchen Lisinopril Cough    Current Outpatient Prescriptions  Medication Sig Dispense Refill  . atorvastatin (LIPITOR) 80 MG tablet TAKE 1 TABLET EVERY DAY    . carvedilol (COREG) 3.125 MG tablet Take 3.125 mg by mouth 2 (two) times daily with a meal.     . clopidogrel (PLAVIX) 75 MG tablet TAKE 1 TABLET (75 MG TOTAL) BY MOUTH DAILY.    Marland Kitchen EQ ASPIRIN ADULT LOW DOSE 81 MG EC tablet Take 81 mg by mouth daily.     Marland Kitchen losartan (COZAAR) 50 MG tablet Take 100 mg by mouth daily.     . multivitamin-iron-minerals-folic acid (CENTRUM) chewable tablet Chew 1 tablet by mouth daily.      No current facility-administered medications for this visit.     REVIEW OF SYSTEMS:  [X]  denotes positive finding, [ ]  denotes negative  finding Cardiac  Comments:  Chest pain or chest pressure:    Shortness of breath upon exertion:    Short of breath when lying flat:    Irregular heart rhythm:        Vascular    Pain in calf, thigh, or hip brought on by ambulation: X   Pain in feet at night that wakes you up from your sleep:     Blood clot in your veins:    Leg swelling:         Pulmonary    Oxygen at home:    Productive cough:     Wheezing:         Neurologic    Sudden weakness in arms or legs:     Sudden numbness in arms or legs:     Sudden onset of difficulty speaking or slurred speech:    Temporary loss of vision in one eye:     Problems with dizziness:         Gastrointestinal    Blood in stool:      Vomited blood:         Genitourinary    Burning when urinating:     Blood in urine:        Psychiatric    Major depression:         Hematologic    Bleeding problems:    Problems with blood clotting too easily:        Skin    Rashes or ulcers:        Constitutional    Fever or chills:     PHYSICAL EXAM:   Vitals:   01/21/17 1459  BP: 130/82  Pulse: 70  Resp: 20  Temp: 97.9 F (36.6 C)  TempSrc: Oral  SpO2: 99%  Weight: 185 lb (83.9 kg)  Height: 5\' 5"  (1.651 m)    GENERAL: The patient is a well-nourished male, in no acute distress. The vital signs are documented above. CARDIAC: There is a regular rate and rhythm.  VASCULAR: I do not detect carotid bruits. On the right side, which is the symptomatic side, I can palpate a femoral pulse.  I cannot palpate popliteal or pedal pulses.  He has a monophasic posterior tibial and dorsalis pedis signal on the right with the Doppler. On the left side he has a palpable femoral, popliteal, and posterior tibial pulse.  With the Doppler, he has a biphasic dorsalis pedis and posterior tibial signal. PULMONARY: There is good air exchange bilaterally without wheezing or rales. ABDOMEN: Soft and non-tender with normal pitched bowel sounds.  MUSCULOSKELETAL: There are no major deformities or cyanosis. NEUROLOGIC: No focal weakness or paresthesias are detected. SKIN: There are no ulcers or rashes noted. PSYCHIATRIC: The patient has a normal affect.  DATA:    RIGHT LOWER EXTREMITY ARTERIAL DUPLEX: I have been apparently interpreted his right lower extremity arterial duplex scan.  There are triphasic Doppler signals in the common femoral, deep femoral and proximal superficial femoral arteries.  The signals become monophasic in the mid superficial femoral artery.  There is a 50-74% stenosis at this location.  MEDICAL ISSUES:   RIGHT LOWER EXTREMITY CLAUDICATION: This patient has stable right lower extremity claudication.  Based on his  duplex he has a superficial femoral artery stenosis in the mid thigh.  Thus I do not think that the symptoms are related to the intra-aortic balloon pump that was in his right common femoral artery.  His symptoms are tolerable and we have  discussed conservative treatment.  Fortunately, he quit smoking in July 2017.  We discussed a structured walking program and I have encouraged him to stay as active as possible.  We also discussed the importance of nutrition.  I would only recommend arteriography and possible intervention if his symptoms became disabling or progressed to the point of rest pain.  I have ordered follow-up ABIs in 6 months and I will see him back at that time.  He knows to call sooner if he has problems.  Deitra Mayo [facility] [phone]

## 2017-01-22 NOTE — Addendum Note (Signed)
Addended by: Lianne Cure A on: 01/22/2017 10:12 AM   Modules accepted: Orders

## 2017-05-12 ENCOUNTER — Other Ambulatory Visit: Payer: Self-pay | Admitting: *Deleted

## 2017-05-12 DIAGNOSIS — R072 Precordial pain: Secondary | ICD-10-CM

## 2017-05-12 DIAGNOSIS — R911 Solitary pulmonary nodule: Secondary | ICD-10-CM

## 2017-05-12 NOTE — Progress Notes (Unsigned)
Ct c 

## 2017-06-25 ENCOUNTER — Ambulatory Visit: Payer: Medicare Other | Admitting: Cardiothoracic Surgery

## 2017-06-25 ENCOUNTER — Other Ambulatory Visit: Payer: Medicare Other

## 2017-07-09 ENCOUNTER — Encounter: Payer: Self-pay | Admitting: *Deleted

## 2017-07-09 ENCOUNTER — Ambulatory Visit
Admission: RE | Admit: 2017-07-09 | Discharge: 2017-07-09 | Disposition: A | Discharge status: Home / Self Care | Payer: Medicare Other | Source: Ambulatory Visit | Attending: Cardiothoracic Surgery | Admitting: Cardiothoracic Surgery

## 2017-07-09 ENCOUNTER — Ambulatory Visit (INDEPENDENT_AMBULATORY_CARE_PROVIDER_SITE_OTHER): Payer: Medicare Other | Admitting: Cardiothoracic Surgery

## 2017-07-09 VITALS — BP 134/78 | HR 65 | Resp 20 | Ht 65.0 in | Wt 187.0 lb

## 2017-07-09 DIAGNOSIS — Z951 Presence of aortocoronary bypass graft: Secondary | ICD-10-CM

## 2017-07-09 DIAGNOSIS — R911 Solitary pulmonary nodule: Secondary | ICD-10-CM | POA: Diagnosis not present
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
## 2017-07-29 ENCOUNTER — Ambulatory Visit: Payer: Medicare Other | Admitting: Vascular Surgery

## 2017-07-29 ENCOUNTER — Encounter (HOSPITAL_COMMUNITY): Payer: Medicare Other

## 2017-09-09 ENCOUNTER — Encounter (HOSPITAL_COMMUNITY): Payer: Medicare Other

## 2017-09-09 ENCOUNTER — Ambulatory Visit: Payer: Medicare Other | Admitting: Vascular Surgery

## 2017-09-14 DIAGNOSIS — R7989 Other specified abnormal findings of blood chemistry: Secondary | ICD-10-CM

## 2017-09-14 HISTORY — DX: Other specified abnormal findings of blood chemistry: R79.89

## 2017-09-16 ENCOUNTER — Ambulatory Visit (HOSPITAL_COMMUNITY)
Admission: RE | Admit: 2017-09-16 | Discharge: 2017-09-16 | Disposition: A | Discharge status: Home / Self Care | Payer: Medicare Other | Source: Ambulatory Visit | Attending: Vascular Surgery | Admitting: Vascular Surgery

## 2017-09-16 ENCOUNTER — Other Ambulatory Visit: Payer: Self-pay

## 2017-09-16 ENCOUNTER — Ambulatory Visit (INDEPENDENT_AMBULATORY_CARE_PROVIDER_SITE_OTHER): Payer: Medicare Other | Admitting: Vascular Surgery

## 2017-09-16 ENCOUNTER — Encounter: Payer: Self-pay | Admitting: Vascular Surgery

## 2017-09-16 VITALS — BP 129/86 | HR 69 | Temp 98.0°F | Resp 16 | Ht 65.0 in | Wt 188.0 lb

## 2017-09-16 DIAGNOSIS — Z87891 Personal history of nicotine dependence: Secondary | ICD-10-CM | POA: Diagnosis not present

## 2017-09-16 DIAGNOSIS — E785 Hyperlipidemia, unspecified: Secondary | ICD-10-CM | POA: Insufficient documentation

## 2017-09-16 DIAGNOSIS — I10 Essential (primary) hypertension: Secondary | ICD-10-CM | POA: Diagnosis not present

## 2017-09-16 DIAGNOSIS — R0989 Other specified symptoms and signs involving the circulatory and respiratory systems: Secondary | ICD-10-CM | POA: Diagnosis present

## 2017-09-16 DIAGNOSIS — I70219 Atherosclerosis of native arteries of extremities with intermittent claudication, unspecified extremity: Secondary | ICD-10-CM

## 2017-09-16 DIAGNOSIS — I2510 Atherosclerotic heart disease of native coronary artery without angina pectoris: Secondary | ICD-10-CM | POA: Diagnosis not present

## 2017-09-16 NOTE — Progress Notes (Signed)
Patient name: Gabriel [name] MRN: [id number] DOB: Dec 28, 1945 Sex: male  REASON FOR VISIT:   Follow-up of right lower extremity claudication  HPI:   Gabriel [name] is a pleasant 72 y.o. male who I last saw on 01/21/2017.  He was referred with right lower extremity claudication.  He is undergone coronary revascularization previously.  This was after a myocardial infarction in October 2017.  After discharge he began noticing pain in his right calf which is brought on by ambulation and relieved with rest.  This occurred approximate eighth of a mile.  Duplex scan at that time showed evidence of a moderate stenosis in the mid superficial femoral artery on the right.  I felt that the patient has stable right lower extremity claudication I recommended a structured walking program.  He comes in for a six-month follow-up visit.  Since I saw him last, he continues to have right calf claudication.  His symptoms are brought on by ambulation and relieved with rest.  The symptoms are stable over the last 6 months.  Again he can walk about 1/8 of a mile before experiencing symptoms.  He denies any thigh or hip claudication on the right.  He has no left lower extremity symptoms.  He denies any history of rest pain or nonhealing ulcers.  There are no other aggravating or alleviating factors.  He quit smoking in 2017.  Past Medical History:  Diagnosis Date  . CAD (coronary artery disease)   . Hyperlipidemia   . Hypertension   . MI (myocardial infarction) ([place])     History reviewed. No pertinent family history.  SOCIAL HISTORY: Social History   Tobacco Use  . Smoking status: Former Smoker    Packs/day: 1.00    Years: 50.00    Pack years: 50.00    Types: Cigarettes    Last attempt to quit: 10/29/2015    Years since quitting: 1.8  . Smokeless tobacco: Never Used  Substance Use Topics  . Alcohol use: No    Allergies  Allergen Reactions  . Iodinated Diagnostic Agents Other (See Comments)    Unknown to  patient, was told about this post-op.   Marland Kitchen Lisinopril Cough    Current Outpatient Medications  Medication Sig Dispense Refill  . atorvastatin (LIPITOR) 80 MG tablet TAKE 1 TABLET EVERY DAY    . carvedilol (COREG) 3.125 MG tablet Take 3.125 mg by mouth 2 (two) times daily with a meal.     . clopidogrel (PLAVIX) 75 MG tablet TAKE 1 TABLET (75 MG TOTAL) BY MOUTH DAILY.    Marland Kitchen EQ ASPIRIN ADULT LOW DOSE 81 MG EC tablet Take 81 mg by mouth daily.     Marland Kitchen ezetimibe (ZETIA) 10 MG tablet Take 10 mg by mouth daily.    Marland Kitchen losartan (COZAAR) 50 MG tablet Take 100 mg by mouth daily.     . multivitamin-iron-minerals-folic acid (CENTRUM) chewable tablet Chew 1 tablet by mouth daily.      No current facility-administered medications for this visit.     REVIEW OF SYSTEMS:  [X]  denotes positive finding, [ ]  denotes negative finding Cardiac  Comments:  Chest pain or chest pressure:    Shortness of breath upon exertion:    Short of breath when lying flat:    Irregular heart rhythm:        Vascular    Pain in calf, thigh, or hip brought on by ambulation: x   Pain in feet at night that wakes you up from your sleep:  Blood clot in your veins:    Leg swelling:         Pulmonary    Oxygen at home:    Productive cough:     Wheezing:         Neurologic    Sudden weakness in arms or legs:     Sudden numbness in arms or legs:     Sudden onset of difficulty speaking or slurred speech:    Temporary loss of vision in one eye:     Problems with dizziness:         Gastrointestinal    Blood in stool:     Vomited blood:         Genitourinary    Burning when urinating:     Blood in urine:        Psychiatric    Major depression:         Hematologic    Bleeding problems:    Problems with blood clotting too easily:        Skin    Rashes or ulcers:        Constitutional    Fever or chills:     PHYSICAL EXAM:   Vitals:   09/16/17 1409  BP: 129/86  Pulse: 69  Resp: 16  Temp: 98 F (36.7 C)    TempSrc: Oral  SpO2: 99%  Weight: 188 lb (85.3 kg)  Height: 5\' 5"  (1.651 m)   GENERAL: The patient is a well-nourished male, in no acute distress. The vital signs are documented above. CARDIAC: There is a regular rate and rhythm.  VASCULAR: I do not detect carotid bruits. On the right side, which is the symptomatic side, he has a slightly diminished femoral pulse.  Of note he had an intra-aortic balloon pump on the right side.  I cannot palpate popliteal or pedal pulses on the right. On the left side he has a palpable femoral pulse.  I cannot palpate pedal pulses. He has no significant lower extremity swelling. PULMONARY: There is good air exchange bilaterally without wheezing or rales. ABDOMEN: Soft and non-tender with normal pitched bowel sounds.  MUSCULOSKELETAL: There are no major deformities or cyanosis. NEUROLOGIC: No focal weakness or paresthesias are detected. SKIN: There are no ulcers or rashes noted. PSYCHIATRIC: The patient has a normal affect.  DATA:    ARTERIAL DOPPLER STUDY: I have independently interpreted his arterial Doppler study today.   On the right side there is a monophasic dorsalis pedis and posterior tibial signal.  ABI is 59%.  On the left side there is a triphasic dorsalis pedis and posterior tibial signal.  ABI is 100%.  Toe pressure on the left is 94 mmHg.  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  The patient has stable claudication.  I explained that we would typically not consider arteriography and intervention unless he developed disabling claudication, rest pain, or nonhealing ulcer.  Fortunately he quit smoking in 2017.  I have encouraged him to try to walk every day as he is not been doing that.  He states that during the winter it was [name] for him to walk outside.  He is on aspirin, Plavix, and a statin.  I have ordered follow-up ABIs in 1 year and I will see him back at  that time.  He knows to call sooner if he has problems.  Deitra Mayo [facility] [phone]

## 2017-11-13 ENCOUNTER — Other Ambulatory Visit: Payer: Self-pay | Admitting: Cardiology

## 2017-11-13 DIAGNOSIS — I25709 Atherosclerosis of coronary artery bypass graft(s), unspecified, with unspecified angina pectoris: Secondary | ICD-10-CM

## 2017-11-13 MED ORDER — CARVEDILOL 3.125 MG PO TABS: 3.1250 mg | ORAL_TABLET | Freq: Two times a day (BID) | ORAL | 0 refills | Status: DC

## 2017-11-13 MED ORDER — ATORVASTATIN CALCIUM 80 MG PO TABS: 80.0000 mg | ORAL_TABLET | Freq: Every day | ORAL | 0 refills | Status: DC

## 2017-11-13 MED ORDER — LOSARTAN POTASSIUM 50 MG PO TABS: 100.0000 mg | ORAL_TABLET | Freq: Every day | ORAL | 0 refills | Status: DC

## 2017-11-13 MED ORDER — CLOPIDOGREL BISULFATE 75 MG PO TABS: ORAL_TABLET | ORAL | 0 refills | Status: DC

## 2017-11-13 MED ORDER — EZETIMIBE 10 MG PO TABS: 10.0000 mg | ORAL_TABLET | Freq: Every day | ORAL | 0 refills | Status: DC

## 2017-11-13 NOTE — Telephone Encounter (Signed)
The patient is out of town in [place] and forgot his medications at home. He requests a refill of his cardiac medications to [facility] in [place]. A one month prescription of his requested medications was sent.

## 2018-02-09 ENCOUNTER — Other Ambulatory Visit: Payer: Self-pay | Admitting: Cardiothoracic Surgery

## 2018-02-09 DIAGNOSIS — R911 Solitary pulmonary nodule: Secondary | ICD-10-CM

## 2018-03-25 ENCOUNTER — Ambulatory Visit: Payer: Medicare Other | Admitting: Cardiothoracic Surgery

## 2018-03-25 ENCOUNTER — Other Ambulatory Visit: Payer: Medicare Other

## 2018-04-20 DIAGNOSIS — Z0000 Encounter for general adult medical examination without abnormal findings: Secondary | ICD-10-CM

## 2018-04-20 HISTORY — DX: Encounter for general adult medical examination without abnormal findings: Z00.00

## 2018-04-22 ENCOUNTER — Encounter: Payer: Self-pay | Admitting: Cardiothoracic Surgery

## 2018-04-22 ENCOUNTER — Other Ambulatory Visit: Payer: Self-pay

## 2018-04-22 ENCOUNTER — Ambulatory Visit
Admission: RE | Admit: 2018-04-22 | Discharge: 2018-04-22 | Disposition: A | Discharge status: Home / Self Care | Payer: Medicare Other | Source: Ambulatory Visit | Attending: Cardiothoracic Surgery | Admitting: Cardiothoracic Surgery

## 2018-04-22 ENCOUNTER — Ambulatory Visit (INDEPENDENT_AMBULATORY_CARE_PROVIDER_SITE_OTHER): Payer: Medicare Other | Admitting: Cardiothoracic Surgery

## 2018-04-22 VITALS — BP 137/77 | HR 66 | Resp 18 | Ht 65.0 in | Wt 190.8 lb

## 2018-04-22 DIAGNOSIS — R911 Solitary pulmonary nodule: Secondary | ICD-10-CM

## 2018-04-22 DIAGNOSIS — Z951 Presence of aortocoronary bypass graft: Secondary | ICD-10-CM | POA: Diagnosis not present
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
## 2018-10-19 DIAGNOSIS — I739 Peripheral vascular disease, unspecified: Secondary | ICD-10-CM

## 2018-10-19 HISTORY — DX: Peripheral vascular disease, unspecified: I73.9
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
## 2018-12-28 ENCOUNTER — Encounter: Payer: Self-pay | Admitting: Cardiology

## 2018-12-28 ENCOUNTER — Ambulatory Visit (INDEPENDENT_AMBULATORY_CARE_PROVIDER_SITE_OTHER): Payer: Medicare Other | Admitting: Cardiology

## 2018-12-28 ENCOUNTER — Other Ambulatory Visit: Payer: Self-pay

## 2018-12-28 VITALS — BP 164/86 | HR 66 | Temp 97.3°F | Ht 65.0 in | Wt 191.8 lb

## 2018-12-28 DIAGNOSIS — I25709 Atherosclerosis of coronary artery bypass graft(s), unspecified, with unspecified angina pectoris: Secondary | ICD-10-CM | POA: Diagnosis not present

## 2018-12-28 DIAGNOSIS — I25119 Atherosclerotic heart disease of native coronary artery with unspecified angina pectoris: Secondary | ICD-10-CM

## 2018-12-28 DIAGNOSIS — I10 Essential (primary) hypertension: Secondary | ICD-10-CM

## 2018-12-28 DIAGNOSIS — E782 Mixed hyperlipidemia: Secondary | ICD-10-CM | POA: Diagnosis not present

## 2018-12-28 DIAGNOSIS — Z951 Presence of aortocoronary bypass graft: Secondary | ICD-10-CM

## 2018-12-28 MED ORDER — ATORVASTATIN CALCIUM 80 MG PO TABS: 80.0000 mg | ORAL_TABLET | Freq: Every day | ORAL | 1 refills | Status: DC

## 2018-12-28 MED ORDER — LOSARTAN POTASSIUM 100 MG PO TABS: 100.0000 mg | ORAL_TABLET | Freq: Every day | ORAL | 1 refills | Status: DC

## 2018-12-28 MED ORDER — CLOPIDOGREL BISULFATE 75 MG PO TABS: ORAL_TABLET | ORAL | 1 refills | Status: DC

## 2018-12-28 MED ORDER — CARVEDILOL 3.125 MG PO TABS: 3.1250 mg | ORAL_TABLET | Freq: Two times a day (BID) | ORAL | 1 refills | Status: DC

## 2018-12-28 MED ORDER — EZETIMIBE 10 MG PO TABS: 10.0000 mg | ORAL_TABLET | Freq: Every day | ORAL | 1 refills | Status: DC

## 2018-12-28 NOTE — Patient Instructions (Signed)
Medication Instructions:  Your physician recommends that you continue on your current medications as directed. Please refer to the Current Medication list given to you today.  If you need a refill on your cardiac medications before your next appointment, please call your pharmacy.   Lab work: Your physician recommends that you return for lab work today: CMP, lipid panel.   If you have labs (blood work) drawn today and your tests are completely normal, you will receive your results only by: Marland Kitchen MyChart Message (if you have MyChart) OR . A paper copy in the mail If you have any lab test that is abnormal or we need to change your treatment, we will call you to review the results.  Testing/Procedures: You had an EKG today.   Follow-Up: At [facility], you and your health needs are our priority.  As part of our continuing mission to provide you with exceptional heart care, we have created designated Provider Care Teams.  These Care Teams include your primary Cardiologist (physician) and Advanced Practice Providers (APPs -  Physician Assistants and Nurse Practitioners) who all work together to provide you with the care you need, when you need it. You will need a follow up appointment in 6 months.  Please call our office 2 months in advance to schedule this appointment.

## 2018-12-29 LAB — COMPREHENSIVE METABOLIC PANEL
ALT: 27 IU/L (ref 0–44)
AST: 33 IU/L (ref 0–40)
Albumin/Globulin Ratio: 2.2 (ref 1.2–2.2)
Albumin: 4.6 g/dL (ref 3.7–4.7)
Alkaline Phosphatase: 113 IU/L (ref 39–117)
BUN/Creatinine Ratio: 9 — ABNORMAL LOW (ref 10–24)
BUN: 12 mg/dL (ref 8–27)
Bilirubin Total: 0.6 mg/dL (ref 0.0–1.2)
CO2: 22 mmol/L (ref 20–29)
Calcium: 10.1 mg/dL (ref 8.6–10.2)
Chloride: 105 mmol/L (ref 96–106)
Creatinine, Ser: 1.41 mg/dL — ABNORMAL HIGH (ref 0.76–1.27)
GFR calc Af Amer: 58 mL/min/{1.73_m2} — ABNORMAL LOW (ref >59)
GFR calc non Af Amer: 50 mL/min/{1.73_m2} — ABNORMAL LOW (ref >59)
Globulin, Total: 2.1 g/dL (ref 1.5–4.5)
Glucose: 108 mg/dL — ABNORMAL HIGH (ref 65–99)
Potassium: 4.6 mmol/L (ref 3.5–5.2)
Sodium: 141 mmol/L (ref 134–144)
Total Protein: 6.7 g/dL (ref 6.0–8.5)

## 2018-12-29 LAB — LIPID PANEL
Chol/HDL Ratio: 2.5 ratio (ref 0.0–5.0)
Cholesterol, Total: 121 mg/dL (ref 100–199)
HDL: 49 mg/dL (ref >39)
LDL Chol Calc (NIH): 59 mg/dL (ref 0–99)
Triglycerides: 62 mg/dL (ref 0–149)
VLDL Cholesterol Cal: 13 mg/dL (ref 5–40)

## 2019-03-16 ENCOUNTER — Other Ambulatory Visit: Payer: Self-pay | Admitting: *Deleted

## 2019-03-16 DIAGNOSIS — R911 Solitary pulmonary nodule: Secondary | ICD-10-CM

## 2019-04-21 ENCOUNTER — Ambulatory Visit: Payer: Medicare Other | Admitting: Cardiothoracic Surgery

## 2019-05-02 ENCOUNTER — Other Ambulatory Visit: Payer: Self-pay | Admitting: Cardiology

## 2019-05-02 DIAGNOSIS — I25709 Atherosclerosis of coronary artery bypass graft(s), unspecified, with unspecified angina pectoris: Secondary | ICD-10-CM

## 2019-06-06 ENCOUNTER — Ambulatory Visit: Payer: Medicare Other | Admitting: Cardiology

## 2019-06-09 ENCOUNTER — Ambulatory Visit: Payer: Medicare Other | Admitting: Cardiothoracic Surgery

## 2019-06-09 ENCOUNTER — Other Ambulatory Visit: Payer: Medicare Other

## 2019-06-20 ENCOUNTER — Other Ambulatory Visit: Payer: Self-pay | Admitting: Cardiology

## 2019-06-20 DIAGNOSIS — I25709 Atherosclerosis of coronary artery bypass graft(s), unspecified, with unspecified angina pectoris: Secondary | ICD-10-CM
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
## 2019-06-28 ENCOUNTER — Ambulatory Visit (INDEPENDENT_AMBULATORY_CARE_PROVIDER_SITE_OTHER): Payer: Medicare Other | Admitting: Cardiology

## 2019-06-28 ENCOUNTER — Other Ambulatory Visit: Payer: Self-pay

## 2019-06-28 ENCOUNTER — Encounter: Payer: Self-pay | Admitting: Cardiology

## 2019-06-28 VITALS — BP 150/86 | HR 65 | Ht 65.0 in | Wt 190.0 lb

## 2019-06-28 DIAGNOSIS — I10 Essential (primary) hypertension: Secondary | ICD-10-CM | POA: Diagnosis not present

## 2019-06-28 DIAGNOSIS — I25119 Atherosclerotic heart disease of native coronary artery with unspecified angina pectoris: Secondary | ICD-10-CM

## 2019-06-28 DIAGNOSIS — E782 Mixed hyperlipidemia: Secondary | ICD-10-CM | POA: Diagnosis not present

## 2019-06-28 DIAGNOSIS — I739 Peripheral vascular disease, unspecified: Secondary | ICD-10-CM | POA: Diagnosis not present

## 2019-06-28 NOTE — Patient Instructions (Signed)
Medication Instructions:  Your physician recommends that you continue on your current medications as directed. Please refer to the Current Medication list given to you today.  *If you need a refill on your cardiac medications before your next appointment, please call your pharmacy*   Lab Work: None ordered   If you have labs (blood work) drawn today and your tests are completely normal, you will receive your results only by: Marland Kitchen MyChart Message (if you have MyChart) OR . A paper copy in the mail If you have any lab test that is abnormal or we need to change your treatment, we will call you to review the results.   Testing/Procedures: None ordered    Follow-Up: At [facility], you and your health needs are our priority.  As part of our continuing mission to provide you with exceptional heart care, we have created designated Provider Care Teams.  These Care Teams include your primary Cardiologist (physician) and Advanced Practice Providers (APPs -  Physician Assistants and Nurse Practitioners) who all work together to provide you with the care you need, when you need it.  We recommend signing up for the patient portal called "MyChart".  Sign up information is provided on this After Visit Summary.  MyChart is used to connect with patients for Virtual Visits (Telemedicine).  Patients are able to view lab/test results, encounter notes, upcoming appointments, etc.  Non-urgent messages can be sent to your provider as well.   To learn more about what you can do with MyChart, go to [email or web address].    Your next appointment:   12 month(s)  The format for your next appointment:   In Person  Provider:   Shirlee More, MD   Other Instructions Try to get out and walk at least 15- 20 minutes a day

## 2019-06-29 ENCOUNTER — Ambulatory Visit
Admission: RE | Admit: 2019-06-29 | Discharge: 2019-06-29 | Disposition: A | Discharge status: Home / Self Care | Payer: Medicare Other | Source: Ambulatory Visit | Attending: Cardiothoracic Surgery | Admitting: Cardiothoracic Surgery

## 2019-06-29 DIAGNOSIS — R911 Solitary pulmonary nodule: Secondary | ICD-10-CM

## 2019-06-30 ENCOUNTER — Other Ambulatory Visit: Payer: Self-pay

## 2019-06-30 ENCOUNTER — Ambulatory Visit (INDEPENDENT_AMBULATORY_CARE_PROVIDER_SITE_OTHER): Payer: Medicare Other | Admitting: Cardiothoracic Surgery

## 2019-06-30 VITALS — BP 139/88 | HR 95 | Temp 97.7°F | Resp 20 | Ht 66.0 in | Wt 187.0 lb

## 2019-06-30 DIAGNOSIS — I25119 Atherosclerotic heart disease of native coronary artery with unspecified angina pectoris: Secondary | ICD-10-CM

## 2019-06-30 DIAGNOSIS — Z951 Presence of aortocoronary bypass graft: Secondary | ICD-10-CM | POA: Diagnosis not present

## 2019-06-30 DIAGNOSIS — R911 Solitary pulmonary nodule: Secondary | ICD-10-CM
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
## 2019-10-14 DIAGNOSIS — E119 Type 2 diabetes mellitus without complications: Secondary | ICD-10-CM

## 2019-10-14 HISTORY — DX: Type 2 diabetes mellitus without complications: E11.9

## 2019-12-29 DIAGNOSIS — Z860100 Personal history of colon polyps, unspecified: Secondary | ICD-10-CM

## 2019-12-29 DIAGNOSIS — R197 Diarrhea, unspecified: Secondary | ICD-10-CM

## 2019-12-29 DIAGNOSIS — R1013 Epigastric pain: Secondary | ICD-10-CM

## 2019-12-29 DIAGNOSIS — K861 Other chronic pancreatitis: Secondary | ICD-10-CM | POA: Insufficient documentation

## 2019-12-29 HISTORY — DX: Personal history of colonic polyps: Z86.010

## 2019-12-29 HISTORY — DX: Diarrhea, unspecified: R19.7

## 2019-12-29 HISTORY — DX: Other chronic pancreatitis: K86.1

## 2019-12-29 HISTORY — DX: Personal history of colon polyps, unspecified: Z86.0100

## 2019-12-29 HISTORY — DX: Epigastric pain: R10.13

## 2020-02-29 ENCOUNTER — Telehealth: Payer: Self-pay | Admitting: Cardiology

## 2020-02-29 NOTE — Telephone Encounter (Signed)
Patient's wife calling to see if we have received a surgical clearance request from Dr. Marin Comment with [facility]. I confirmed the fax number for our [place] office and advised if they are having trouble getting the fax through to have their office call ours to request clearance. No call back needed - patient wife wanted to make sure our office was looking for that fax.

## 2020-02-29 NOTE — Telephone Encounter (Signed)
Follow Up:    Gabriel [name] is calling to check on the status of pt's clearance. Pt is scheduled for durgery on 03-08-20, she needs this asap please.

## 2020-03-01 NOTE — Telephone Encounter (Signed)
   Primary Cardiologist: Shirlee More, MD  Chart reviewed as part of pre-operative protocol coverage. Patient was contacted 03/01/2020 in reference to pre-operative risk assessment for pending surgery as outlined below.  Gabriel [name] was last seen on 06/28/19 by Dr. Bettina Gavia.  Since that day, Gabriel [name] has done well. He can complete more than 4.0 METS without angina. Per Dr. Bettina Gavia, he may hold plavix for 5-7 days for procedure and resume as soon as safe to do so per surgeon.  Therefore, based on ACC/AHA guidelines, the patient would be at acceptable risk for the planned procedure without further cardiovascular testing.   The patient was advised that if he develops new symptoms prior to surgery to contact our office to arrange for a follow-up visit, and he verbalized understanding.  I will route this recommendation to the requesting party via Epic fax function and remove from pre-op pool. Please call with questions.  Tami Lin Myosha Cuadras, PA 03/01/2020, 2:27 PM

## 2020-03-01 NOTE — Telephone Encounter (Signed)
Spoke with Ms. Gabriel [name] Mr. Null's wife to advise per Dr. Bettina Gavia can hold Plavix 5-7 Days before procedure and resume Plavix as soon as GI Doctor says it is okay to do so.

## 2020-03-01 NOTE — Telephone Encounter (Signed)
Dr. Bettina Gavia This patient is having a colonoscopy on 12/9 - they have called our office twice requesting clearance. We have not yet received the form, but I would like to ask for your guidance on holding plavix to save time given the surgery date.   Pt was last seen 06/28/19 and noted to not need additional ischemic workup. He had a CABG in 2017 and remains on plavix monotherapy. OK to hold plavix 5-7 days?

## 2020-03-01 NOTE — Telephone Encounter (Signed)
° °  [facility] Pre-operative Risk Assessment    HEARTCARE STAFF: - Please ensure there is not already an duplicate clearance open for this procedure. - Under Visit Info/Reason for Call, type in Other and utilize the format Clearance MM/DD/YY or Clearance TBD. Do not use dashes or single digits. - If request is for dental extraction, please clarify the # of teeth to be extracted.  Request for surgical clearance:  1. What type of surgery is being performed? COLONOSCOPY/EGD  2. When is this surgery scheduled? 03/08/20   3. What type of clearance is required (medical clearance vs. Pharmacy clearance to hold med vs. Both)? MEDICAL  4. Are there any medications that need to be held prior to surgery and how long? PLAVIX x 5 DAYS PRIOR TO PROCEDURE   5. Practice name and name of physician performing surgery? [facility]; DR. LE   6. What is the office phone number? [phone]   7.   What is the office fax number? [phone]  8.   Anesthesia type (None, local, MAC, general) ? NOT LISTED   Gabriel [name] 03/01/2020, 2:11 PM  _________________________________________________________________   (provider comments below)

## 2020-03-01 NOTE — Telephone Encounter (Signed)
With stable CAD I think we can safely withdraw clopidogrel and typically resume 48 hours but depends on what is done sometimes they will hold longer after biopsies

## 2020-03-01 NOTE — Telephone Encounter (Signed)
Can you please contact this patient. To my knowledge, we have not received a clearance request. Maybe give them the [facility] or [employer] number? Can you also ask what type of surgery he is having? I will reach out to the primary cardiologist to approve plavix hold.

## 2020-03-02 NOTE — Telephone Encounter (Signed)
I received a faxed copy of this pre-op form again today letting me know that this was their second attempt at getting clearance. I see in the notes that they have been contacted in regards to this already.

## 2020-06-19 ENCOUNTER — Other Ambulatory Visit: Payer: Self-pay | Admitting: *Deleted

## 2020-06-19 DIAGNOSIS — Z87891 Personal history of nicotine dependence: Secondary | ICD-10-CM

## 2020-06-29 DIAGNOSIS — I2510 Atherosclerotic heart disease of native coronary artery without angina pectoris: Secondary | ICD-10-CM | POA: Insufficient documentation

## 2020-06-29 DIAGNOSIS — I219 Acute myocardial infarction, unspecified: Secondary | ICD-10-CM | POA: Insufficient documentation

## 2020-06-29 DIAGNOSIS — I10 Essential (primary) hypertension: Secondary | ICD-10-CM | POA: Insufficient documentation

## 2020-06-29 DIAGNOSIS — R7303 Prediabetes: Secondary | ICD-10-CM | POA: Insufficient documentation

## 2020-06-29 DIAGNOSIS — E785 Hyperlipidemia, unspecified: Secondary | ICD-10-CM | POA: Insufficient documentation

## 2020-07-12 ENCOUNTER — Ambulatory Visit: Payer: Medicare Other | Admitting: Cardiology

## 2020-08-06 ENCOUNTER — Telehealth: Payer: Self-pay | Admitting: Acute Care

## 2020-08-07 NOTE — Telephone Encounter (Signed)
Spoke with pt's wife per DPR . Pt would like to r/s lung screening for sometime in August. I advised her that our schedule is not out that far yet and that I will call when schedule is open. She verbalized understanding.

## 2020-08-07 NOTE — Telephone Encounter (Signed)
LMTC x 1  

## 2020-08-09 ENCOUNTER — Telehealth: Payer: Self-pay | Admitting: Cardiology

## 2020-08-09 ENCOUNTER — Ambulatory Visit (INDEPENDENT_AMBULATORY_CARE_PROVIDER_SITE_OTHER): Payer: Medicare Other | Admitting: Cardiology

## 2020-08-09 ENCOUNTER — Other Ambulatory Visit: Payer: Self-pay

## 2020-08-09 ENCOUNTER — Encounter: Payer: Self-pay | Admitting: Cardiology

## 2020-08-09 VITALS — BP 135/80 | HR 66 | Ht 66.0 in | Wt 180.1 lb

## 2020-08-09 DIAGNOSIS — I25709 Atherosclerosis of coronary artery bypass graft(s), unspecified, with unspecified angina pectoris: Secondary | ICD-10-CM | POA: Diagnosis not present

## 2020-08-09 DIAGNOSIS — I25119 Atherosclerotic heart disease of native coronary artery with unspecified angina pectoris: Secondary | ICD-10-CM | POA: Diagnosis not present

## 2020-08-09 DIAGNOSIS — I10 Essential (primary) hypertension: Secondary | ICD-10-CM | POA: Diagnosis not present

## 2020-08-09 MED ORDER — ATORVASTATIN CALCIUM 80 MG PO TABS: 1.0000 | ORAL_TABLET | Freq: Every day | ORAL | 3 refills | Status: DC

## 2020-08-09 MED ORDER — AMLODIPINE BESYLATE 10 MG PO TABS: 1.0000 | ORAL_TABLET | Freq: Every day | ORAL | 3 refills | Status: DC

## 2020-08-09 MED ORDER — CLOPIDOGREL BISULFATE 75 MG PO TABS: ORAL_TABLET | ORAL | 3 refills | Status: DC

## 2020-08-09 MED ORDER — CARVEDILOL 3.125 MG PO TABS: 3.1250 mg | ORAL_TABLET | Freq: Two times a day (BID) | ORAL | 3 refills | Status: DC

## 2020-08-09 MED ORDER — EZETIMIBE 10 MG PO TABS: 10.0000 mg | ORAL_TABLET | Freq: Every day | ORAL | 3 refills | Status: DC

## 2020-08-09 NOTE — Telephone Encounter (Signed)
Medications sent to wrong pharmacy. Sent again today to [facility]

## 2020-08-09 NOTE — Telephone Encounter (Signed)
*  STAT* If patient is at the pharmacy, call can be transferred to refill team.   1. Which medications need to be refilled? (please list name of each medication and dose if known)  carvedilol (COREG) 3.125 MG tablet atorvastatin (LIPITOR) 80 MG tablet clopidogrel (PLAVIX) 75 MG tablet ezetimibe (ZETIA) 10 MG tablet amLODipine (NORVASC) 10 MG tablet  2. Which pharmacy/location (including street and city if local pharmacy) is medication to be sent to?  [facility] [address] [place] [address] [phone] 3. Do they need a 30 day or 90 day supply?  90 day supply  PT refill [facility] instead of [facility] mail order. PT is not all out of this medication but Dr. Bettina Gavia wrote it to last up until next year's appt.

## 2020-08-09 NOTE — Patient Instructions (Signed)
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
## 2020-08-13 ENCOUNTER — Ambulatory Visit: Payer: Medicare Other

## 2020-08-13 ENCOUNTER — Encounter: Payer: Medicare Other | Admitting: Acute Care

## 2020-09-04 ENCOUNTER — Telehealth: Payer: Self-pay | Admitting: Cardiology

## 2020-09-04 NOTE — Telephone Encounter (Signed)
Work into my office hours [place] Thursday or Friday

## 2020-09-04 NOTE — Telephone Encounter (Signed)
Called and spoke to patient wife per dpr. She reports he started having some left sided chest pain while walking on Saturday it was on his left side of the chest no radiation of pain. It lasted about 10 minutes. No shortness of breath/nausea. The pain came back last night while laying down, he sat up and it went away after a little while. No pain now. Patient would like recommendation from Dr. Bettina Gavia and if he thinks this is concerning. Patient doesn't have nitro. Will check with Dr. Bettina Gavia.

## 2020-09-04 NOTE — Telephone Encounter (Signed)
Pt c/o of Chest Pain: STAT if CP now or developed within 24 hours  1. Are you having CP right now? Started Saturday  When he was walking and last night had some chest pain- but not at this time  2. Are you experiencing any other symptoms (ex. SOB, nausea, vomiting, sweating)?  *  3. How long have you been experiencing CP? Started on Saturday  4. Is your CP continuous or coming and going? Stays for a few minutes  5. Have you taken Nitroglycerin? no ?

## 2020-09-04 NOTE — Telephone Encounter (Signed)
Spoke to patient wife per dpr. Got patient added to Dr. Bettina Gavia schedule on Thursday. Advised her if pain returns, or gets stronger to go to the emergency room. She understood. She will call if they need [place] before Thursday. No further questions.
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
## 2020-09-06 ENCOUNTER — Encounter: Payer: Self-pay | Admitting: Cardiology

## 2020-09-06 ENCOUNTER — Other Ambulatory Visit: Payer: Self-pay

## 2020-09-06 ENCOUNTER — Ambulatory Visit (INDEPENDENT_AMBULATORY_CARE_PROVIDER_SITE_OTHER): Payer: Medicare Other | Admitting: Cardiology

## 2020-09-06 ENCOUNTER — Telehealth: Payer: Self-pay | Admitting: Cardiology

## 2020-09-06 VITALS — BP 120/62 | HR 61 | Ht 66.0 in | Wt 179.0 lb

## 2020-09-06 DIAGNOSIS — E782 Mixed hyperlipidemia: Secondary | ICD-10-CM | POA: Diagnosis not present

## 2020-09-06 DIAGNOSIS — I25709 Atherosclerosis of coronary artery bypass graft(s), unspecified, with unspecified angina pectoris: Secondary | ICD-10-CM | POA: Diagnosis not present

## 2020-09-06 DIAGNOSIS — I10 Essential (primary) hypertension: Secondary | ICD-10-CM | POA: Diagnosis not present

## 2020-09-06 DIAGNOSIS — R072 Precordial pain: Secondary | ICD-10-CM

## 2020-09-06 MED ORDER — NITROGLYCERIN 0.4 MG SL SUBL: 0.4000 mg | SUBLINGUAL_TABLET | SUBLINGUAL | 3 refills | Status: DC | PRN

## 2020-09-06 MED ORDER — ISOSORBIDE MONONITRATE ER 30 MG PO TB24: 30.0000 mg | ORAL_TABLET | Freq: Every day | ORAL | 3 refills | Status: DC

## 2020-09-06 NOTE — Telephone Encounter (Signed)
Refills sent in per request 

## 2020-09-06 NOTE — Telephone Encounter (Signed)
*  STAT* If patient is at the pharmacy, call can be transferred to refill team.   1. Which medications need to be refilled? (please list name of each medication and dose if known) nitroGLYCERIN (NITROSTAT) 0.4 MG SL tablet isosorbide mononitrate (IMDUR) 30 MG 24 hr tablet  2. Which pharmacy/location (including street and city if local pharmacy) is medication to be sent to? [place]  3. Do they need a 30 day or 90 day supply? 90 day supply   Pt is out this medication and it was sent to the wrong pharmacy

## 2020-09-06 NOTE — Patient Instructions (Signed)
Medication Instructions:  Your physician has recommended you make the following change in your medication: START: Imdur 30 mg take one tablet by mouth daily.  START: Nitroglycerin 0.4 mg take one tablet by mouth every 5 minutes up to three times as needed for chest pain.  *If you need a refill on your cardiac medications before your next appointment, please call your pharmacy*   Lab Work: None If you have labs (blood work) drawn today and your tests are completely normal, you will receive your results only by: [place] (if you have MyChart) OR A paper copy in the mail If you have any lab test that is abnormal or we need to change your treatment, we will call you to review the results.   Testing/Procedures:   [facility] Cardiovascular Imaging at [facility] [address] [place], [place] [address] Phone: [phone]    Please arrive 15 minutes prior to your appointment time for registration and insurance purposes.  The test will take approximately 3 to 4 hours to complete; you may bring reading material.  If someone comes with you to your appointment, they will need to remain in the main lobby due to limited space in the testing area. **If you are pregnant or breastfeeding, please notify the nuclear lab prior to your appointment**  How to prepare for your Myocardial Perfusion Test: Do not eat or drink 3 hours prior to your test, except you may have water. Do not consume products containing caffeine (regular or decaffeinated) 12 hours prior to your test. (ex: coffee, chocolate, sodas, tea). Do bring a list of your current medications with you.  If not listed below, you may take your medications as normal. Do wear comfortable clothes (no dresses or overalls) and walking shoes, tennis shoes preferred (No heels or open toe shoes are allowed). Do NOT wear cologne, perfume, aftershave, or lotions (deodorant is allowed). If these instructions are not followed,  your test will have to be rescheduled.  Please report to [address] for your test.  If you have questions or concerns about your appointment, you can call the Nuclear Lab at [phone].  If you cannot keep your appointment, please provide 24 hours notification to the Nuclear Lab, to avoid a possible $50 charge to your account.    Follow-Up: At [facility], you and your health needs are our priority.  As part of our continuing mission to provide you with exceptional heart care, we have created designated Provider Care Teams.  These Care Teams include your primary Cardiologist (physician) and Advanced Practice Providers (APPs -  Physician Assistants and Nurse Practitioners) who all work together to provide you with the care you need, when you need it.  We recommend signing up for the patient portal called "MyChart".  Sign up information is provided on this After Visit Summary.  MyChart is used to connect with patients for Virtual Visits (Telemedicine).  Patients are able to view lab/test results, encounter notes, upcoming appointments, etc.  Non-urgent messages can be sent to your provider as well.   To learn more about what you can do with MyChart, go to [email or web address].    Your next appointment:   4 week(s)  The format for your next appointment:   In Person  Provider:   Shirlee More, MD   Other Instructions

## 2020-09-11 ENCOUNTER — Telehealth (HOSPITAL_COMMUNITY): Payer: Self-pay | Admitting: *Deleted

## 2020-09-11 ENCOUNTER — Encounter: Payer: Self-pay | Admitting: Cardiology

## 2020-09-11 ENCOUNTER — Telehealth (HOSPITAL_COMMUNITY): Payer: Self-pay | Admitting: Radiology

## 2020-09-11 NOTE — Telephone Encounter (Signed)
Patient's spouse verified detailed instructions per Myocardial Perfusion Study Information Sheet for the test on 09/17/20 at 0730. Patient notified to arrive 15 minutes early and that it is imperative to arrive on time for appointment to keep from having the test rescheduled.  If you need to cancel or reschedule your appointment, please call the office within 24 hours of your appointment. . Patient verbalized understanding.  TMY

## 2020-09-11 NOTE — Telephone Encounter (Signed)
Patient given detailed instructions per Myocardial Perfusion Study Information Sheet for the test on 09/17/20 at 7:30. Patient notified to arrive 15 minutes early and that it is imperative to arrive on time for appointment to keep from having the test rescheduled.  If you need to cancel or reschedule your appointment, please call the office within 24 hours of your appointment. . Patient verbalized understanding.Gabriel [name]

## 2020-09-12 NOTE — Addendum Note (Signed)
Addended by: Shirlee More on: 09/12/2020 12:10 PM   Modules accepted: Orders

## 2020-09-17 ENCOUNTER — Other Ambulatory Visit: Payer: Self-pay

## 2020-09-17 ENCOUNTER — Ambulatory Visit (HOSPITAL_COMMUNITY): Payer: Medicare Other | Attending: Cardiovascular Disease

## 2020-09-17 DIAGNOSIS — R072 Precordial pain: Secondary | ICD-10-CM | POA: Insufficient documentation

## 2020-09-17 LAB — MYOCARDIAL PERFUSION IMAGING
Estimated workload: 10.1 METS
Exercise duration (min): 9 min
Exercise duration (sec): 0 s
LV dias vol: 111 mL (ref 62–150)
LV sys vol: 62 mL
MPHR: 148 {beats}/min
Peak HR: 123 {beats}/min
Percent HR: 83 %
Rest HR: 62 {beats}/min
SDS: 12
SRS: 0
SSS: 12
TID: 1.01

## 2020-09-17 MED ORDER — TECHNETIUM TC 99M TETROFOSMIN IV KIT
9.9000 | PACK | Freq: Once | INTRAVENOUS | Status: AC | PRN
Start: 2020-09-17 — End: 2020-09-17
  Administered 2020-09-17: 9.9 via INTRAVENOUS
  Filled 2020-09-17: qty 10

## 2020-09-17 MED ORDER — TECHNETIUM TC 99M TETROFOSMIN IV KIT
31.1000 | PACK | Freq: Once | INTRAVENOUS | Status: AC | PRN
Start: 2020-09-17 — End: 2020-09-17
  Administered 2020-09-17: 31.1 via INTRAVENOUS
  Filled 2020-09-17: qty 32

## 2020-09-18 ENCOUNTER — Telehealth: Payer: Self-pay | Admitting: Cardiology

## 2020-09-18 ENCOUNTER — Telehealth: Payer: Self-pay

## 2020-09-18 NOTE — Telephone Encounter (Signed)
Spoke with patient regarding results and recommendation.  Patient verbalizes understanding and is agreeable to plan of care. Advised patient to call back with any issues or concerns.  

## 2020-09-18 NOTE — Telephone Encounter (Signed)
Spoke to the patients wife just now and she let me know that they are going to see [facility] Dr. Scot Dock on 10/17/2020 and this is why they rescheduled Dr. Bettina Gavia to 11/01/2020. I told her that this was totally fine and she verbalizes understanding.

## 2020-09-18 NOTE — Telephone Encounter (Signed)
PT is requesting a callback to discuss some personal matters in regards to the procedure he had done on yesterday

## 2020-09-18 NOTE — Telephone Encounter (Signed)
-----   Message from Berniece Salines, DO sent at 09/18/2020  3:55 PM EDT ----- Please let the patient know that I am covering for Dr. Bettina Gavia this week and I would like to see him and discuss his stress test results, virtual visit on my schedule tomorrow will be fine let him know that he will need further testing.

## 2020-09-19 ENCOUNTER — Telehealth (INDEPENDENT_AMBULATORY_CARE_PROVIDER_SITE_OTHER): Payer: Medicare Other | Admitting: Cardiology

## 2020-09-19 ENCOUNTER — Encounter: Payer: Self-pay | Admitting: Cardiology

## 2020-09-19 ENCOUNTER — Telehealth: Payer: Self-pay

## 2020-09-19 VITALS — BP 127/71 | HR 74 | Ht 66.0 in | Wt 175.0 lb

## 2020-09-19 DIAGNOSIS — R9439 Abnormal result of other cardiovascular function study: Secondary | ICD-10-CM | POA: Diagnosis not present

## 2020-09-19 DIAGNOSIS — I2510 Atherosclerotic heart disease of native coronary artery without angina pectoris: Secondary | ICD-10-CM | POA: Diagnosis not present

## 2020-09-19 MED ORDER — PREDNISONE 50 MG PO TABS: ORAL_TABLET | ORAL | 0 refills | Status: DC

## 2020-09-19 NOTE — Telephone Encounter (Signed)
Called and spoke with patient's wife about discharge instructions. She verbalized understanding,. No further questions at this time.
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
## 2020-09-19 NOTE — Patient Instructions (Addendum)
Medication Instructions:   Your physician recommends that you continue on your current medications as directed. Please refer to the Current Medication list given to you today.  *If you need a refill on your cardiac medications before your next appointment, please call your pharmacy*   Lab Work:  Your physician recommends that you return for lab work in:  Next week Monday: BMET, Mag, CBC  If you have labs (blood work) drawn today and your tests are completely normal, you will receive your results only by: [facility] (if you have MyChart) OR A paper copy in the mail If you have any lab test that is abnormal or we need to change your treatment, we will call you to review the results.   Testing/Procedures:     [place] [address] Dept: [phone] Loc: [phone]  Denman Pichardo  09/19/2020  You are scheduled for a Cardiac Catheterization on Wednesday, June 29 with Dr. Glenetta Hew.  1. Please arrive at the [facility] (Main Entrance A) at [facility]: [address] [place], [place] [address] at 6:30 AM (This time is two hours before your procedure to ensure your preparation). Free valet parking service is available.   Special note: Every effort is made to have your procedure done on time. Please understand that emergencies sometimes delay scheduled procedures.  2. Diet: Do not eat solid foods after midnight.  The patient may have clear liquids until 5am upon the day of the procedure.  3. Labs: You will need to have blood drawn on Monday, June 27 at [employer]: [place]. You do not need to be fasting.  4. Medication instructions in preparation for your procedure:   Contrast Allergy: Yes, Please take Prednisone 50mg  by mouth at: Thirteen hours prior to cath 5:30pm on Tuesday Seven hours prior to cath 11:30pm on Tuesday And prior to leaving home  please take last dose of Prednisone 50mg  and Benadryl 50mg  by mouth.  On the morning of your procedure, take your Aspirin and any morning medicines NOT listed above.  You may use sips of water.  5. Plan for one night stay--bring personal belongings. 6. Bring a current list of your medications and current insurance cards. 7. You MUST have a responsible person to drive you home. 8. Someone MUST be with you the first 24 hours after you arrive home or your discharge will be delayed. 9. Please wear clothes that are easy to get on and off and wear slip-on shoes.  Thank you for allowing [place] to care for you!   -- [facility]    Follow-Up: At [facility], you and your health needs are our priority.  As part of our continuing mission to provide you with exceptional heart care, we have created designated Provider Care Teams.  These Care Teams include your primary Cardiologist (physician) and Advanced Practice Providers (APPs -  Physician Assistants and Nurse Practitioners) who all work together to provide you with the care you need, when you need it.  We recommend signing up for the patient portal called "MyChart".  Sign up information is provided on this After Visit Summary.  MyChart is used to connect with patients for Virtual Visits (Telemedicine).  Patients are able to view lab/test results, encounter notes, upcoming appointments, etc.  Non-urgent messages can be sent to your provider as well.   To learn more about what you can  do with MyChart, go to [email or web address].    Your next appointment:   2 week(s) after Cath  The format for your next appointment:   In Person  Provider:   Shirlee More, MD   Other Instructions

## 2020-09-25 ENCOUNTER — Telehealth: Payer: Self-pay | Admitting: *Deleted

## 2020-09-25 LAB — BASIC METABOLIC PANEL
BUN/Creatinine Ratio: 11 (ref 10–24)
BUN: 15 mg/dL (ref 8–27)
CO2: 21 mmol/L (ref 20–29)
Calcium: 10 mg/dL (ref 8.6–10.2)
Chloride: 102 mmol/L (ref 96–106)
Creatinine, Ser: 1.38 mg/dL — ABNORMAL HIGH (ref 0.76–1.27)
Glucose: 162 mg/dL — ABNORMAL HIGH (ref 65–99)
Potassium: 4.3 mmol/L (ref 3.5–5.2)
Sodium: 139 mmol/L (ref 134–144)
eGFR: 54 mL/min/{1.73_m2} — ABNORMAL LOW (ref >59)

## 2020-09-25 LAB — CBC WITH DIFFERENTIAL/PLATELET
Basophils Absolute: 0 10*3/uL (ref 0.0–0.2)
Basos: 1 %
EOS (ABSOLUTE): 0.1 10*3/uL (ref 0.0–0.4)
Eos: 2 %
Hematocrit: 43.7 % (ref 37.5–51.0)
Hemoglobin: 14.7 g/dL (ref 13.0–17.7)
Immature Grans (Abs): 0 10*3/uL (ref 0.0–0.1)
Immature Granulocytes: 0 %
Lymphocytes Absolute: 1.9 10*3/uL (ref 0.7–3.1)
Lymphs: 29 %
MCH: 29.9 pg (ref 26.6–33.0)
MCHC: 33.6 g/dL (ref 31.5–35.7)
MCV: 89 fL (ref 79–97)
Monocytes Absolute: 0.4 10*3/uL (ref 0.1–0.9)
Monocytes: 6 %
Neutrophils Absolute: 4.2 10*3/uL (ref 1.4–7.0)
Neutrophils: 62 %
Platelets: 198 10*3/uL (ref 150–450)
RBC: 4.91 x10E6/uL (ref 4.14–5.80)
RDW: 12.2 % (ref 11.6–15.4)
WBC: 6.7 10*3/uL (ref 3.4–10.8)

## 2020-09-25 LAB — MAGNESIUM: Magnesium: 2.1 mg/dL (ref 1.6–2.3)

## 2020-09-25 NOTE — Telephone Encounter (Signed)
Pt contacted pre-catheterization scheduled at [facility] for: Wednesday September 26, 2020 8:30 AM Verified arrival time and place: [facility]) at: 6:30 AM   No solid food after midnight prior to cath, clear liquids until 5 AM day of procedure.  CONTRAST ALLERGY: yes-13 hour Prednisone and Benadryl Prep reviewed with patient: 09/25/20 Prednisone 50 mg 7:30 PM 09/26/20 Prednisone 50 mg 1:30 AM 09/26/20 Prednisone 50 mg and Benadryl 50 mg -just prior to leaving for hospital AM of procedure    AM meds can be  taken pre-cath with sips of water including: Aspirin 81 mg Plavix 75 mg Prednisone 50 mg Benadryl 50 mg  Confirmed patient has responsible adult to drive home post procedure and be with patient first 24 hours after arriving home: yes  You are allowed ONE visitor in the waiting room during the time you are at the hospital for your procedure. Both you and your visitor must wear a mask once you enter the hospital.   Patient reports does not currently have any symptoms concerning for COVID-19 and no household members with COVID-19 like illness.     Reviewed procedure/mask/visitor instructions with patient.

## 2020-09-26 ENCOUNTER — Ambulatory Visit (HOSPITAL_COMMUNITY)
Admission: RE | Admit: 2020-09-26 | Discharge: 2020-09-26 | Disposition: A | Discharge status: Home / Self Care | Payer: Medicare Other | Attending: Cardiology | Admitting: Cardiology

## 2020-09-26 ENCOUNTER — Encounter (HOSPITAL_COMMUNITY)
Admission: RE | Disposition: A | Discharge status: Home / Self Care | Payer: Self-pay | Source: Home / Self Care | Attending: Cardiology

## 2020-09-26 ENCOUNTER — Other Ambulatory Visit: Payer: Self-pay

## 2020-09-26 ENCOUNTER — Encounter (HOSPITAL_COMMUNITY): Payer: Self-pay | Admitting: Cardiology

## 2020-09-26 DIAGNOSIS — Z951 Presence of aortocoronary bypass graft: Secondary | ICD-10-CM | POA: Diagnosis not present

## 2020-09-26 DIAGNOSIS — Z87891 Personal history of nicotine dependence: Secondary | ICD-10-CM | POA: Diagnosis not present

## 2020-09-26 DIAGNOSIS — Z79899 Other long term (current) drug therapy: Secondary | ICD-10-CM | POA: Diagnosis not present

## 2020-09-26 DIAGNOSIS — R9439 Abnormal result of other cardiovascular function study: Secondary | ICD-10-CM

## 2020-09-26 DIAGNOSIS — E119 Type 2 diabetes mellitus without complications: Secondary | ICD-10-CM

## 2020-09-26 DIAGNOSIS — E782 Mixed hyperlipidemia: Secondary | ICD-10-CM | POA: Diagnosis not present

## 2020-09-26 DIAGNOSIS — Z91041 Radiographic dye allergy status: Secondary | ICD-10-CM | POA: Insufficient documentation

## 2020-09-26 DIAGNOSIS — I25119 Atherosclerotic heart disease of native coronary artery with unspecified angina pectoris: Secondary | ICD-10-CM

## 2020-09-26 DIAGNOSIS — I2582 Chronic total occlusion of coronary artery: Secondary | ICD-10-CM | POA: Insufficient documentation

## 2020-09-26 DIAGNOSIS — I209 Angina pectoris, unspecified: Secondary | ICD-10-CM

## 2020-09-26 DIAGNOSIS — I25118 Atherosclerotic heart disease of native coronary artery with other forms of angina pectoris: Secondary | ICD-10-CM | POA: Diagnosis present

## 2020-09-26 DIAGNOSIS — Z7902 Long term (current) use of antithrombotics/antiplatelets: Secondary | ICD-10-CM | POA: Insufficient documentation

## 2020-09-26 DIAGNOSIS — I10 Essential (primary) hypertension: Secondary | ICD-10-CM | POA: Diagnosis not present

## 2020-09-26 DIAGNOSIS — R7303 Prediabetes: Secondary | ICD-10-CM | POA: Insufficient documentation

## 2020-09-26 DIAGNOSIS — Z888 Allergy status to other drugs, medicaments and biological substances status: Secondary | ICD-10-CM | POA: Diagnosis not present

## 2020-09-26 HISTORY — PX: LEFT HEART CATH AND CORS/GRAFTS ANGIOGRAPHY: CATH118250

## 2020-09-26 HISTORY — DX: Abnormal result of other cardiovascular function study: R94.39

## 2020-09-26 SURGERY — LEFT HEART CATH AND CORS/GRAFTS ANGIOGRAPHY
Anesthesia: LOCAL

## 2020-09-26 MED ORDER — SODIUM CHLORIDE 0.9 % WEIGHT BASED INFUSION
3.0000 mL/kg/h | INTRAVENOUS | Status: AC
  Administered 2020-09-26: 3 mL/kg/h via INTRAVENOUS

## 2020-09-26 MED ORDER — SODIUM CHLORIDE 0.9% FLUSH: 3.0000 mL | Freq: Two times a day (BID) | INTRAVENOUS | Status: DC

## 2020-09-26 MED ORDER — LIDOCAINE HCL (PF) 1 % IJ SOLN
INTRAMUSCULAR | Status: DC | PRN
  Administered 2020-09-26: 2 mL

## 2020-09-26 MED ORDER — HEPARIN SODIUM (PORCINE) 1000 UNIT/ML IJ SOLN
INTRAMUSCULAR | Status: AC
  Filled 2020-09-26: qty 1

## 2020-09-26 MED ORDER — SODIUM CHLORIDE 0.9 % IV SOLN: 250.0000 mL | INTRAVENOUS | Status: DC | PRN

## 2020-09-26 MED ORDER — ASPIRIN 81 MG PO CHEW: 81.0000 mg | CHEWABLE_TABLET | ORAL | Status: DC

## 2020-09-26 MED ORDER — ONDANSETRON HCL 4 MG/2ML IJ SOLN: 4.0000 mg | Freq: Four times a day (QID) | INTRAMUSCULAR | Status: DC | PRN

## 2020-09-26 MED ORDER — HEPARIN (PORCINE) IN NACL 1000-0.9 UT/500ML-% IV SOLN
INTRAVENOUS | Status: AC
  Filled 2020-09-26: qty 1000

## 2020-09-26 MED ORDER — MIDAZOLAM HCL 2 MG/2ML IJ SOLN
INTRAMUSCULAR | Status: AC
  Filled 2020-09-26: qty 2

## 2020-09-26 MED ORDER — LABETALOL HCL 5 MG/ML IV SOLN: 10.0000 mg | INTRAVENOUS | Status: DC | PRN

## 2020-09-26 MED ORDER — SODIUM CHLORIDE 0.9 % WEIGHT BASED INFUSION
1.0000 mL/kg/h | INTRAVENOUS | Status: DC
  Administered 2020-09-26: 1 mL/kg/h via INTRAVENOUS

## 2020-09-26 MED ORDER — LIDOCAINE HCL (PF) 1 % IJ SOLN
INTRAMUSCULAR | Status: AC
  Filled 2020-09-26: qty 30

## 2020-09-26 MED ORDER — SODIUM CHLORIDE 0.9% FLUSH: 3.0000 mL | INTRAVENOUS | Status: DC | PRN

## 2020-09-26 MED ORDER — VERAPAMIL HCL 2.5 MG/ML IV SOLN
INTRAVENOUS | Status: DC | PRN
  Administered 2020-09-26: 10 mL via INTRA_ARTERIAL

## 2020-09-26 MED ORDER — FENTANYL CITRATE (PF) 100 MCG/2ML IJ SOLN
INTRAMUSCULAR | Status: DC | PRN
  Administered 2020-09-26: 25 ug via INTRAVENOUS

## 2020-09-26 MED ORDER — HEPARIN SODIUM (PORCINE) 1000 UNIT/ML IJ SOLN
INTRAMUSCULAR | Status: DC | PRN
  Administered 2020-09-26: 4000 [IU] via INTRAVENOUS

## 2020-09-26 MED ORDER — IOHEXOL 350 MG/ML SOLN
INTRAVENOUS | Status: DC | PRN
  Administered 2020-09-26: 90 mL

## 2020-09-26 MED ORDER — HYDRALAZINE HCL 20 MG/ML IJ SOLN: 10.0000 mg | INTRAMUSCULAR | Status: DC | PRN

## 2020-09-26 MED ORDER — MIDAZOLAM HCL 2 MG/2ML IJ SOLN
INTRAMUSCULAR | Status: DC | PRN
  Administered 2020-09-26: 1 mg via INTRAVENOUS

## 2020-09-26 MED ORDER — FENTANYL CITRATE (PF) 100 MCG/2ML IJ SOLN
INTRAMUSCULAR | Status: AC
  Filled 2020-09-26: qty 2

## 2020-09-26 MED ORDER — ACETAMINOPHEN 325 MG PO TABS: 650.0000 mg | ORAL_TABLET | ORAL | Status: DC | PRN

## 2020-09-26 MED ORDER — VERAPAMIL HCL 2.5 MG/ML IV SOLN
INTRAVENOUS | Status: AC
  Filled 2020-09-26: qty 2

## 2020-09-26 MED ORDER — HEPARIN (PORCINE) IN NACL 1000-0.9 UT/500ML-% IV SOLN
INTRAVENOUS | Status: DC | PRN
  Administered 2020-09-26 (×2): 500 mL

## 2020-09-26 MED ORDER — SODIUM CHLORIDE 0.9 % IV SOLN: INTRAVENOUS | Status: DC

## 2020-09-26 MED ORDER — CLOPIDOGREL BISULFATE 75 MG PO TABS: 75.0000 mg | ORAL_TABLET | ORAL | Status: DC

## 2020-09-26 SURGICAL SUPPLY — 12 items
CATH INFINITI 5 FR IM (CATHETERS) ×2 IMPLANT
CATH INFINITI 5FR AL1 (CATHETERS) ×2 IMPLANT
CATH LAUNCHER 6FR EBU3.5 (CATHETERS) ×2 IMPLANT
CATH OPTITORQUE TIG 4.0 5F (CATHETERS) ×2 IMPLANT
DEVICE RAD COMP TR BAND LRG (VASCULAR PRODUCTS) ×2 IMPLANT
GLIDESHEATH SLEND SS 6F .021 (SHEATH) ×2 IMPLANT
KIT HEART LEFT (KITS) ×2 IMPLANT
PACK CARDIAC CATHETERIZATION (CUSTOM PROCEDURE TRAY) ×2 IMPLANT
TRANSDUCER W/STOPCOCK (MISCELLANEOUS) ×2 IMPLANT
TUBING CIL FLEX 10 FLL-RA (TUBING) ×2 IMPLANT
WIRE EMERALD 3MM-J .035X260CM (WIRE) ×2 IMPLANT
WIRE HI TORQ VERSACORE-J 145CM (WIRE) ×2 IMPLANT

## 2020-09-26 NOTE — Interval H&P Note (Signed)
History and Physical Interval Note:  09/26/2020 8:42 AM  Gabriel [name]  has presented today for surgery, with the diagnosis of abn stress / cad / Class III angina.  The various methods of treatment have been discussed with the patient and family. After consideration of risks, benefits and other options for treatment, the patient has consented to  Procedure(s): LEFT HEART CATH AND CORS/GRAFTS ANGIOGRAPHY (N/A) as a surgical intervention.  The patient's history has been reviewed, patient examined, no change in status, stable for surgery.  I have reviewed the patient's chart and labs.  Questions were answered to the patient's satisfaction.    Cath Lab Visit (complete for each Cath Lab visit)  Clinical Evaluation Leading to the Procedure:   ACS: No.  Non-ACS:    Anginal Classification: CCS III  Anti-ischemic medical therapy: Maximal Therapy (2 or more classes of medications)  Non-Invasive Test Results: High-risk stress test findings: cardiac mortality >3%/year  Prior CABG: Previous CABG   Glenetta Hew

## 2020-09-26 NOTE — Progress Notes (Signed)
Pt ambulated without difficulty or bleeding.   Discharged home with his wife who will drive and stay with pt x 24 hrs. 

## 2020-09-26 NOTE — Brief Op Note (Addendum)
09/26/2020  9:39 AM  PROCEDURE:  Procedure(s): LEFT HEART CATH AND CORS/GRAFTS ANGIOGRAPHY (N/A)  SURGEON:  Surgeon(s) and Role:    * Leonie Man, MD - Primary  PRE-OPERATIVE DIAGNOSIS:  abn stress  cad -> Known Severe LM-LCx-RI-LAD disease & SubTO RCA w/p CABG x 3 (LIMA-LAD, SVG-rPDA, SVG-OM)  PATIENT:  Gabriel [name]  75 y.o. male with a distant history of non-STEMI in 2004 with PCI to the distal RCA.  He subsequently had a non-STEMI in September 2017 where he is found to have severe mid and distal RCA disease with TIMI II flow as well as severe 90+ % mid to distal Left Main and ostial disease in the LAD, RI and LCx.  He underwent three-vessel CABG (LIMA-LAD, SVG-OM, SVG-PDA).  Was seen by Dr.Munley for evaluation of exertional chest pain concerning for angina.  He underwent stress test which showed mostly fixed lateral defect where it is high risk.  He is therefore referred for invasive valuation cardiac catheterization and possible PCI.  He has been premedicated for contrast hypersensitivity with standard prednisone dosing.  POST-OPERATIVE DIAGNOSIS: Severe multivessel native CAD, 2/3 grafts patent with occluded SVG-OM Mid to distal LM 99% Ostial LAD 100% -> mid LAD fills via LIMA to LAD (retrograde filling of D1-D2 and D3.  There is moderate to severe diffuse disease from the ostium up to D2) After an aneurysmal segment, 99% subtotal occlusion of LCx that courses in the AV groove and gives off at least 1 OM branch in the AV groove circumflex with diffuse disease.  TIMI II flow 99% subtotal occlusion of Ramus Intermedius -> the ramus appears to have bifurcated into a superior and inferior branch, the superior branch is occluded in the inferior branch is patent with TIMI II flow Moderate diffuse proximal to mid RCA disease with 100% mid occlusion Moderate to severely elevated LVEDP with borderline hypotension consistent with Diastolic Heart Failure  Time Out: Verified patient  identification, verified procedure, site/side was marked, verified correct patient position, special equipment/implants available, medications/allergies/relevent history reviewed, required imaging and test results available. Performed.  Access:  LEFT Radial Artery: 6 Fr sheath -- Seldinger technique using Micropuncture Kit -- Direct ultrasound guidance used.  Permanent image obtained and placed on chart. -- 10 mL radial cocktail IA; 4000 Units IV Heparin  Left Heart Catheterization: 5Fr Catheters advanced or exchanged over a J-wire under direct fluoroscopic guidance into the ascending aorta; TIG 4.0 catheter advanced first.  * LV Hemodynamics (NO LV Gram): AL-1 Catheter * Left Coronary Artery Cineangiography: both TIG 4.0 & EBU 3.5 Guide Catheters * Right Coronary Artery (SVG-rPDA  & SVG-OM)  Cineangiography: TIG 4.0 Catheter (AL-1 catheter was also used to try to visualize SVG-OM) * LIMA-LAD Cineangiography: TIG 4.0 catheter was redirected into Left Subclavian Artery & exchanged over long-exchange wire for IMA catheter.  Review of initial angiography revealed: Severe native CAD as noted above.  Also occluded SVG-OM  Preparations are made for plans for additional medical management  Upon completion of Angiogaphy, the catheter was removed completely out of the body over a wire, without complication.  Radial sheath removed in the Cardiac Catheterization lab with TR Band placed for hemostasis.  TR Band: 0935  Hours; 15 mL air  MEDICATIONS SQ Lidocaine 4 mL Radial Cocktail: 3 mg Verapmil in 10 mL NS Heparin: 4000 units  ANESTHESIA:   IV sedation 1 mg Versed, 25 mcg fentanyl  EBL:  <20 mL  DICTATION: .Note written in EPIC  PLAN OF CARE: Discharge to  home after PACU Will explore options for medical management first.  With relatively low blood pressure, not able to tolerate much more than low-dose carvedilol.  Was started on Imdur.  He is also on medications for claudication.  Could  consider Ranexa. If medical management fails, would then need to consider the possibility of high risk LAD-bifurcation RI/LCx atherectomy/PCI versus redo CABG.  PCI will be very difficult and somewhat high risk, but with patent LIMA, would be protected Left Main.  There would be a high likelihood of losing 1 or both of the LCx or RI. However, after discussion with a fellow Interventional Cardiologist, we both feel that there really is no good interventional option that would likely provide meaningful result.  Best recommendation would be medical management.     PATIENT DISPOSITION:  PACU - hemodynamically stable.   Delay start of Pharmacological VTE agent (>24hrs) due to surgical blood loss or risk of bleeding: not applicable.   Glenetta Hew, MD He has been premedicated for contrast hypersensitivity with

## 2020-09-26 NOTE — Discharge Instructions (Signed)
Radial Site Care  This sheet gives you information about how to care for yourself after your procedure. Your health care provider may also give you more specific instructions. If you have problems or questions, contact your health care provider. What can I expect after the procedure? After the procedure, it is common to have: Bruising and tenderness at the catheter insertion area. Follow these instructions at home: Medicines Take over-the-counter and prescription medicines only as told by your health care provider. Insertion site care Follow instructions from your health care provider about how to take care of your insertion site. Make sure you: Wash your hands with soap and water before you remove your bandage (dressing). If soap and water are not available, use hand sanitizer. May remove dressing in 24 hours. Check your insertion site every day for signs of infection. Check for: Redness, swelling, or pain. Fluid or blood. Pus or a bad smell. Warmth. Do no take baths, swim, or use a hot tub for 5 days. You may shower 24-48 hours after the procedure. Remove the dressing and gently wash the site with plain soap and water. Pat the area dry with a clean towel. Do not rub the site. That could cause bleeding. Do not apply powder or lotion to the site. Activity  For 24 hours after the procedure, or as directed by your health care provider: Do not flex or bend the affected arm. Do not push or pull heavy objects with the affected arm. Do not drive yourself home from the hospital or clinic. You may drive 24 hours after the procedure. Do not operate machinery or power tools. KEEP ARM ELEVATED THE REMAINDER OF THE DAY. Do not push, pull or lift anything that is heavier than 10 lb for 5 days. Ask your health care provider when it is okay to: Return to work or school. Resume usual physical activities or sports. Resume sexual activity. General instructions If the catheter site starts to  bleed, raise your arm and put firm pressure on the site. If the bleeding does not stop, get help right away. This is a medical emergency. DRINK PLENTY OF FLUIDS FOR THE NEXT 2-3 DAYS. No alcohol consumption for 24 hours after receiving sedation. If you went home on the same day as your procedure, a responsible adult should be with you for the first 24 hours after you arrive home. Keep all follow-up visits as told by your health care provider. This is important. Contact a health care provider if: You have a fever. You have redness, swelling, or yellow drainage around your insertion site. Get help right away if: You have unusual pain at the radial site. The catheter insertion area swells very fast. The insertion area is bleeding, and the bleeding does not stop when you hold steady pressure on the area. Your arm or hand becomes pale, cool, tingly, or numb. These symptoms may represent a serious problem that is an emergency. Do not wait to see if the symptoms will go away. Get medical help right away. Call your local emergency services (911 in the [place] Do not drive yourself to the hospital. Summary After the procedure, it is common to have bruising and tenderness at the site. Follow instructions from your health care provider about how to take care of your radial site wound. Check the wound every day for signs of infection.  This information is not intended to replace advice given to you by your health care provider. Make sure you discuss any questions you have with   your health care provider. Document Revised: 04/22/2017 Document Reviewed: 04/22/2017 Elsevier Patient Education  2020 [employer]  

## 2020-10-05 ENCOUNTER — Other Ambulatory Visit: Payer: Self-pay | Admitting: *Deleted

## 2020-10-05 DIAGNOSIS — I739 Peripheral vascular disease, unspecified: Secondary | ICD-10-CM

## 2020-10-08 ENCOUNTER — Other Ambulatory Visit: Payer: Self-pay

## 2020-10-10 ENCOUNTER — Other Ambulatory Visit: Payer: Self-pay

## 2020-10-10 ENCOUNTER — Encounter: Payer: Self-pay | Admitting: Cardiology

## 2020-10-10 ENCOUNTER — Ambulatory Visit (INDEPENDENT_AMBULATORY_CARE_PROVIDER_SITE_OTHER): Payer: Medicare Other | Admitting: Cardiology

## 2020-10-10 VITALS — BP 128/60 | HR 63 | Ht 66.0 in | Wt 179.6 lb

## 2020-10-10 DIAGNOSIS — I2510 Atherosclerotic heart disease of native coronary artery without angina pectoris: Secondary | ICD-10-CM

## 2020-10-10 DIAGNOSIS — Z951 Presence of aortocoronary bypass graft: Secondary | ICD-10-CM

## 2020-10-10 DIAGNOSIS — E782 Mixed hyperlipidemia: Secondary | ICD-10-CM | POA: Diagnosis not present

## 2020-10-10 DIAGNOSIS — I10 Essential (primary) hypertension: Secondary | ICD-10-CM | POA: Diagnosis not present
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
## 2020-10-10 NOTE — Patient Instructions (Signed)

## 2020-10-11 ENCOUNTER — Ambulatory Visit: Payer: Medicare Other | Admitting: Cardiology

## 2020-10-17 ENCOUNTER — Encounter (HOSPITAL_COMMUNITY): Payer: Medicare Other

## 2020-10-17 ENCOUNTER — Ambulatory Visit: Payer: Medicare Other | Admitting: Vascular Surgery

## 2020-11-01 ENCOUNTER — Ambulatory Visit: Payer: Medicare Other | Admitting: Cardiology

## 2020-11-09 ENCOUNTER — Other Ambulatory Visit: Payer: Self-pay

## 2020-11-09 MED ORDER — NITROGLYCERIN 0.4 MG SL SUBL: 0.4000 mg | SUBLINGUAL_TABLET | SUBLINGUAL | 10 refills | Status: DC | PRN

## 2020-11-09 MED ORDER — ISOSORBIDE MONONITRATE ER 30 MG PO TB24: 30.0000 mg | ORAL_TABLET | Freq: Every day | ORAL | 2 refills | Status: DC

## 2020-11-09 NOTE — Telephone Encounter (Signed)
Refills of Imdur 30 mg and Nitroglycerin 0.4 mg sent to [facility].

## 2021-04-09 ENCOUNTER — Other Ambulatory Visit: Payer: Self-pay | Admitting: Cardiology

## 2021-06-25 ENCOUNTER — Other Ambulatory Visit: Payer: Self-pay | Admitting: Cardiology

## 2021-06-25 DIAGNOSIS — I25709 Atherosclerosis of coronary artery bypass graft(s), unspecified, with unspecified angina pectoris: Secondary | ICD-10-CM

## 2021-09-24 DIAGNOSIS — F99 Mental disorder, not otherwise specified: Secondary | ICD-10-CM

## 2021-09-24 HISTORY — DX: Mental disorder, not otherwise specified: F99

## 2021-10-17 ENCOUNTER — Other Ambulatory Visit: Payer: Self-pay

## 2021-10-24 ENCOUNTER — Ambulatory Visit: Payer: Medicare Other | Admitting: Cardiology
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
## 2021-12-11 ENCOUNTER — Ambulatory Visit: Payer: Medicare Other | Attending: Cardiology | Admitting: Cardiology

## 2021-12-11 ENCOUNTER — Encounter: Payer: Self-pay | Admitting: Cardiology

## 2021-12-11 VITALS — BP 142/72 | HR 60 | Ht 65.0 in | Wt 183.4 lb

## 2021-12-11 DIAGNOSIS — I10 Essential (primary) hypertension: Secondary | ICD-10-CM

## 2021-12-11 DIAGNOSIS — Z951 Presence of aortocoronary bypass graft: Secondary | ICD-10-CM

## 2021-12-11 DIAGNOSIS — E782 Mixed hyperlipidemia: Secondary | ICD-10-CM | POA: Diagnosis present

## 2021-12-11 DIAGNOSIS — I2510 Atherosclerotic heart disease of native coronary artery without angina pectoris: Secondary | ICD-10-CM

## 2021-12-11 MED ORDER — NITROGLYCERIN 0.4 MG SL SUBL: 0.4000 mg | SUBLINGUAL_TABLET | SUBLINGUAL | 1 refills | Status: DC | PRN

## 2021-12-11 NOTE — Patient Instructions (Addendum)
Medication Instructions:  Your physician has recommended you make the following change in your medication:   START: Nitroglycerin 0.4 mg under the tongue every 5 minutes as needed for chest pain. STOP: Imdur  *If you need a refill on your cardiac medications before your next appointment, please call your pharmacy*   Lab Work: Your physician recommends that you return for lab work in:   Labs today: Lipids  If you have labs (blood work) drawn today and your tests are completely normal, you will receive your results only by: [place] (if you have MyChart) OR A paper copy in the mail If you have any lab test that is abnormal or we need to change your treatment, we will call you to review the results.   Testing/Procedures: None   Follow-Up: At [facility], you and your health needs are our priority.  As part of our continuing mission to provide you with exceptional heart care, we have created designated Provider Care Teams.  These Care Teams include your primary Cardiologist (physician) and Advanced Practice Providers (APPs -  Physician Assistants and Nurse Practitioners) who all work together to provide you with the care you need, when you need it.  We recommend signing up for the patient portal called "MyChart".  Sign up information is provided on this After Visit Summary.  MyChart is used to connect with patients for Virtual Visits (Telemedicine).  Patients are able to view lab/test results, encounter notes, upcoming appointments, etc.  Non-urgent messages can be sent to your provider as well.   To learn more about what you can do with MyChart, go to [email or web address].    Your next appointment:   1 year(s)  The format for your next appointment:   In Person  Provider:   Shirlee More, MD    Other Instructions None  Important Information About Sugar          Healthbeat  Tips to measure your blood pressure correctly  To determine whether you  have hypertension, a medical professional will take a blood pressure reading. How you prepare for the test, the position of your arm, and other factors can change a blood pressure reading by 10% or more. That could be enough to hide high blood pressure, start you on a drug you don't really need, or lead your doctor to incorrectly adjust your medications. National and international guidelines offer specific instructions for measuring blood pressure. If a doctor, nurse, or medical assistant isn't doing it right, don't hesitate to ask him or her to get with the guidelines. Here's what you can do to ensure a correct reading:  Don't drink a caffeinated beverage or smoke during the 30 minutes before the test.  Sit quietly for five minutes before the test begins.  During the measurement, sit in a chair with your feet on the floor and your arm supported so your elbow is at about heart level.  The inflatable part of the cuff should completely cover at least 80% of your upper arm, and the cuff should be placed on bare skin, not over a shirt.  Don't talk during the measurement.  Have your blood pressure measured twice, with a brief break in between. If the readings are different by 5 points or more, have it done a third time. There are times to break these rules. If you sometimes feel lightheaded when getting out of bed in the morning or when you stand after sitting, you should have your blood pressure checked while seated  and then while standing to see if it falls from one position to the next. Because blood pressure varies throughout the day, your doctor will rarely diagnose hypertension on the basis of a single reading. Instead, he or she will want to confirm the measurements on at least two occasions, usually within a few weeks of one another. The exception to this rule is if you have a blood pressure reading of 180/110 mm Hg or higher. A result this high usually calls for prompt treatment. It's also a good idea  to have your blood pressure measured in both arms at least once, since the reading in one arm (usually the right) may be higher than that in the left. A 2014 study in The American Journal of Medicine of nearly 3,400 people found average arm- to-arm differences in systolic blood pressure of about 5 points. The higher number should be used to make treatment decisions. In 2017, new guidelines from the [place], the [employer] of Cardiology, and nine other health organizations lowered the diagnosis of high blood pressure to 130/80 mm Hg or higher for all adults. The guidelines also redefined the various blood pressure categories to now include normal, elevated, Stage 1 hypertension, Stage 2 hypertension, and hypertensive crisis (see "Blood pressure categories"). Blood pressure categories  Blood pressure category SYSTOLIC (upper number)  DIASTOLIC (lower number)  Normal Less than 120 mm Hg and Less than 80 mm Hg  Elevated 120-129 mm Hg and Less than 80 mm Hg  High blood pressure: Stage 1 hypertension 130-139 mm Hg or 80-89 mm Hg  High blood pressure: Stage 2 hypertension 140 mm Hg or higher or 90 mm Hg or higher  Hypertensive crisis (consult your doctor immediately) Higher than 180 mm Hg and/or Higher than 120 mm Hg  Source: American Heart Association and American Stroke Association. For more on getting your blood pressure under control, buy Controlling Your Blood Pressure, a Special Health Report from [facility].   .[email or web address]

## 2021-12-12 LAB — LIPID PANEL
Chol/HDL Ratio: 2.3 ratio (ref 0.0–5.0)
Cholesterol, Total: 117 mg/dL (ref 100–199)
HDL: 50 mg/dL (ref >39)
LDL Chol Calc (NIH): 54 mg/dL (ref 0–99)
Triglycerides: 59 mg/dL (ref 0–149)
VLDL Cholesterol Cal: 13 mg/dL (ref 5–40)

## 2022-02-03 ENCOUNTER — Other Ambulatory Visit: Payer: Self-pay | Admitting: Cardiology

## 2022-02-03 NOTE — Telephone Encounter (Signed)
Rx refill sent to pharmacy. 

## 2022-02-12 ENCOUNTER — Other Ambulatory Visit: Payer: Self-pay | Admitting: Cardiology

## 2022-02-12 NOTE — Telephone Encounter (Signed)
Refills sent to pharmacy. 

## 2022-04-01 ENCOUNTER — Telehealth: Payer: Self-pay | Admitting: Cardiology

## 2022-04-01 NOTE — Telephone Encounter (Signed)
  Pt is requesting to Switch from Dr. Bettina Gavia to Dr. Harriet Masson

## 2022-06-23 ENCOUNTER — Ambulatory Visit: Payer: Medicare Other | Admitting: Cardiology

## 2022-07-01 ENCOUNTER — Ambulatory Visit: Payer: Medicare Other | Attending: Cardiology | Admitting: Cardiology

## 2022-07-01 ENCOUNTER — Encounter: Payer: Self-pay | Admitting: Cardiology

## 2022-07-01 VITALS — BP 133/79 | HR 61 | Ht 65.0 in | Wt 181.0 lb

## 2022-07-01 DIAGNOSIS — I10 Essential (primary) hypertension: Secondary | ICD-10-CM | POA: Diagnosis not present

## 2022-07-01 DIAGNOSIS — Z951 Presence of aortocoronary bypass graft: Secondary | ICD-10-CM

## 2022-07-01 DIAGNOSIS — E782 Mixed hyperlipidemia: Secondary | ICD-10-CM | POA: Diagnosis not present

## 2022-07-01 DIAGNOSIS — I25119 Atherosclerotic heart disease of native coronary artery with unspecified angina pectoris: Secondary | ICD-10-CM | POA: Diagnosis not present
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
## 2022-07-01 NOTE — Patient Instructions (Addendum)
Medication Instructions:  Your physician recommends that you continue on your current medications as directed. Please refer to the Current Medication list given to you today.  *If you need a refill on your cardiac medications before your next appointment, please call your pharmacy*   Lab Work: Your physician recommends that you have labs drawn: Lipids If you have labs (blood work) drawn today and your tests are completely normal, you will receive your results only by: MyChart Message (if you have MyChart) OR A paper copy in the mail If you have any lab test that is abnormal or we need to change your treatment, we will call you to review the results.   Testing/Procedures: None   Follow-Up: At [facility], you and your health needs are our priority.  As part of our continuing mission to provide you with exceptional heart care, we have created designated Provider Care Teams.  These Care Teams include your primary Cardiologist (physician) and Advanced Practice Providers (APPs -  Physician Assistants and Nurse Practitioners) who all work together to provide you with the care you need, when you need it.  Your next appointment:   6 month(s)  Provider:   Berniece Salines, DO

## 2022-07-11 ENCOUNTER — Other Ambulatory Visit: Payer: Self-pay | Admitting: Cardiology

## 2022-07-11 NOTE — Telephone Encounter (Signed)
Rx to pharmacy

## 2023-01-08 ENCOUNTER — Other Ambulatory Visit: Payer: Self-pay | Admitting: Cardiology

## 2023-01-08 NOTE — Telephone Encounter (Signed)
Rx refill sent to pharmacy. 

## 2023-05-14 ENCOUNTER — Other Ambulatory Visit: Payer: Self-pay | Admitting: Cardiology

## 2023-05-30 ENCOUNTER — Other Ambulatory Visit: Payer: Self-pay | Admitting: Cardiology

## 2023-06-22 ENCOUNTER — Other Ambulatory Visit: Payer: Self-pay | Admitting: Cardiology
